# Patient Record
Sex: Female | Born: 1985 | Race: Black or African American | Hispanic: No | Marital: Single | Smoking: Former smoker
Health system: Southern US, Community
[De-identification: ages and names within clinical notes are randomized; demographics above are authoritative.]

## PROBLEM LIST (undated history)

## (undated) ENCOUNTER — Inpatient Hospital Stay (HOSPITAL_COMMUNITY): Payer: Self-pay

## (undated) DIAGNOSIS — F419 Anxiety disorder, unspecified: Secondary | ICD-10-CM

## (undated) DIAGNOSIS — M5020 Other cervical disc displacement, unspecified cervical region: Secondary | ICD-10-CM

## (undated) DIAGNOSIS — A599 Trichomoniasis, unspecified: Secondary | ICD-10-CM

## (undated) DIAGNOSIS — E559 Vitamin D deficiency, unspecified: Secondary | ICD-10-CM

## (undated) DIAGNOSIS — Z8619 Personal history of other infectious and parasitic diseases: Secondary | ICD-10-CM

## (undated) DIAGNOSIS — L309 Dermatitis, unspecified: Secondary | ICD-10-CM

## (undated) DIAGNOSIS — F32A Depression, unspecified: Secondary | ICD-10-CM

## (undated) DIAGNOSIS — F329 Major depressive disorder, single episode, unspecified: Secondary | ICD-10-CM

## (undated) DIAGNOSIS — Z789 Other specified health status: Secondary | ICD-10-CM

## (undated) DIAGNOSIS — D219 Benign neoplasm of connective and other soft tissue, unspecified: Secondary | ICD-10-CM

## (undated) DIAGNOSIS — A749 Chlamydial infection, unspecified: Secondary | ICD-10-CM

## (undated) DIAGNOSIS — O34219 Maternal care for unspecified type scar from previous cesarean delivery: Secondary | ICD-10-CM

## (undated) DIAGNOSIS — B009 Herpesviral infection, unspecified: Secondary | ICD-10-CM

## (undated) DIAGNOSIS — I8001 Phlebitis and thrombophlebitis of superficial vessels of right lower extremity: Secondary | ICD-10-CM

## (undated) HISTORY — PX: WISDOM TOOTH EXTRACTION: SHX21

## (undated) HISTORY — DX: Benign neoplasm of connective and other soft tissue, unspecified: D21.9

## (undated) HISTORY — DX: Vitamin D deficiency, unspecified: E55.9

## (undated) HISTORY — DX: Other cervical disc displacement, unspecified cervical region: M50.20

---
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
## 2006-06-21 ENCOUNTER — Emergency Department (HOSPITAL_COMMUNITY): Admission: EM | Admit: 2006-06-21 | Discharge: 2006-06-21 | Payer: Self-pay | Admitting: Emergency Medicine

## 2006-07-11 ENCOUNTER — Emergency Department (HOSPITAL_COMMUNITY): Admission: EM | Admit: 2006-07-11 | Discharge: 2006-07-11 | Payer: Self-pay | Admitting: Emergency Medicine

## 2006-09-21 ENCOUNTER — Inpatient Hospital Stay (HOSPITAL_COMMUNITY): Admission: AD | Admit: 2006-09-21 | Discharge: 2006-09-21 | Payer: Self-pay | Admitting: Obstetrics and Gynecology

## 2006-12-20 ENCOUNTER — Emergency Department (HOSPITAL_COMMUNITY): Admission: EM | Admit: 2006-12-20 | Discharge: 2006-12-20 | Payer: Self-pay | Admitting: Emergency Medicine

## 2007-04-05 ENCOUNTER — Emergency Department (HOSPITAL_COMMUNITY): Admission: EM | Admit: 2007-04-05 | Discharge: 2007-04-05 | Payer: Self-pay | Admitting: Emergency Medicine

## 2007-04-10 ENCOUNTER — Emergency Department (HOSPITAL_COMMUNITY): Admission: EM | Admit: 2007-04-10 | Discharge: 2007-04-10 | Payer: Self-pay | Admitting: Family Medicine

## 2007-10-27 ENCOUNTER — Emergency Department (HOSPITAL_COMMUNITY): Admission: EM | Admit: 2007-10-27 | Discharge: 2007-10-27 | Payer: Self-pay | Admitting: Family Medicine

## 2007-11-06 ENCOUNTER — Emergency Department (HOSPITAL_COMMUNITY): Admission: EM | Admit: 2007-11-06 | Discharge: 2007-11-06 | Payer: Self-pay | Admitting: Emergency Medicine

## 2008-04-19 ENCOUNTER — Emergency Department (HOSPITAL_COMMUNITY): Admission: EM | Admit: 2008-04-19 | Discharge: 2008-04-19 | Payer: Self-pay | Admitting: Family Medicine

## 2008-05-03 ENCOUNTER — Emergency Department (HOSPITAL_COMMUNITY): Admission: EM | Admit: 2008-05-03 | Discharge: 2008-05-03 | Payer: Self-pay | Admitting: Emergency Medicine

## 2008-06-04 ENCOUNTER — Emergency Department (HOSPITAL_COMMUNITY): Admission: EM | Admit: 2008-06-04 | Discharge: 2008-06-04 | Payer: Self-pay | Admitting: Family Medicine

## 2008-07-21 ENCOUNTER — Emergency Department (HOSPITAL_COMMUNITY): Admission: EM | Admit: 2008-07-21 | Discharge: 2008-07-21 | Payer: Self-pay | Admitting: Emergency Medicine

## 2008-12-17 ENCOUNTER — Emergency Department (HOSPITAL_COMMUNITY): Admission: EM | Admit: 2008-12-17 | Discharge: 2008-12-17 | Payer: Self-pay | Admitting: Family Medicine

## 2009-03-18 ENCOUNTER — Inpatient Hospital Stay (HOSPITAL_COMMUNITY): Admission: AD | Admit: 2009-03-18 | Discharge: 2009-03-19 | Payer: Self-pay | Admitting: Obstetrics and Gynecology

## 2009-06-02 ENCOUNTER — Ambulatory Visit (HOSPITAL_COMMUNITY): Admission: RE | Admit: 2009-06-02 | Discharge: 2009-06-02 | Payer: Self-pay | Admitting: Obstetrics and Gynecology

## 2009-08-02 ENCOUNTER — Inpatient Hospital Stay (HOSPITAL_COMMUNITY): Admission: AD | Admit: 2009-08-02 | Discharge: 2009-08-03 | Payer: Self-pay | Admitting: Obstetrics and Gynecology

## 2009-08-17 ENCOUNTER — Inpatient Hospital Stay (HOSPITAL_COMMUNITY): Admission: AD | Admit: 2009-08-17 | Discharge: 2009-08-18 | Payer: Self-pay | Admitting: Obstetrics and Gynecology

## 2009-08-20 ENCOUNTER — Inpatient Hospital Stay (HOSPITAL_COMMUNITY): Admission: AD | Admit: 2009-08-20 | Discharge: 2009-08-20 | Payer: Self-pay | Admitting: Obstetrics and Gynecology

## 2009-08-21 ENCOUNTER — Inpatient Hospital Stay (HOSPITAL_COMMUNITY): Admission: AD | Admit: 2009-08-21 | Discharge: 2009-08-25 | Payer: Self-pay | Admitting: Obstetrics and Gynecology

## 2009-08-22 ENCOUNTER — Encounter (INDEPENDENT_AMBULATORY_CARE_PROVIDER_SITE_OTHER): Payer: Self-pay | Admitting: Obstetrics and Gynecology

## 2009-12-04 ENCOUNTER — Inpatient Hospital Stay (HOSPITAL_COMMUNITY): Admission: AD | Admit: 2009-12-04 | Discharge: 2009-12-04 | Payer: Self-pay | Admitting: Obstetrics and Gynecology

## 2010-06-17 ENCOUNTER — Encounter: Payer: Self-pay | Admitting: Obstetrics and Gynecology

## 2010-08-12 LAB — GC/CHLAMYDIA PROBE AMP, GENITAL: GC Probe Amp, Genital: NEGATIVE

## 2010-08-12 LAB — URINE MICROSCOPIC-ADD ON

## 2010-08-12 LAB — URINALYSIS, ROUTINE W REFLEX MICROSCOPIC
Bilirubin Urine: NEGATIVE
Glucose, UA: NEGATIVE mg/dL
Ketones, ur: NEGATIVE mg/dL
Leukocytes, UA: NEGATIVE
Nitrite: NEGATIVE
Protein, ur: NEGATIVE mg/dL
Specific Gravity, Urine: 1.02 (ref 1.005–1.030)
Urobilinogen, UA: 0.2 mg/dL (ref 0.0–1.0)
pH: 6.5 (ref 5.0–8.0)

## 2010-08-12 LAB — WET PREP, GENITAL: Trich, Wet Prep: NONE SEEN

## 2010-08-12 LAB — POCT PREGNANCY, URINE: Preg Test, Ur: NEGATIVE

## 2010-08-19 LAB — CBC
HCT: 26 % — ABNORMAL LOW (ref 36.0–46.0)
Hemoglobin: 11.5 g/dL — ABNORMAL LOW (ref 12.0–15.0)
Hemoglobin: 9 g/dL — ABNORMAL LOW (ref 12.0–15.0)
MCHC: 33.4 g/dL (ref 30.0–36.0)
MCV: 95.4 fL (ref 78.0–100.0)
Platelets: 178 10*3/uL (ref 150–400)
RBC: 3.62 MIL/uL — ABNORMAL LOW (ref 3.87–5.11)
WBC: 12.1 10*3/uL — ABNORMAL HIGH (ref 4.0–10.5)
WBC: 14.7 10*3/uL — ABNORMAL HIGH (ref 4.0–10.5)

## 2010-08-19 LAB — RPR: RPR Ser Ql: NONREACTIVE

## 2010-08-30 LAB — URINALYSIS, ROUTINE W REFLEX MICROSCOPIC
Bilirubin Urine: NEGATIVE
Glucose, UA: NEGATIVE mg/dL
Hgb urine dipstick: NEGATIVE
Specific Gravity, Urine: 1.02 (ref 1.005–1.030)
Urobilinogen, UA: 0.2 mg/dL (ref 0.0–1.0)
pH: 6 (ref 5.0–8.0)

## 2010-09-02 LAB — POCT URINALYSIS DIP (DEVICE)
Bilirubin Urine: NEGATIVE
Glucose, UA: NEGATIVE mg/dL
Hgb urine dipstick: NEGATIVE
Ketones, ur: NEGATIVE mg/dL
Nitrite: POSITIVE — AB
Specific Gravity, Urine: 1.015 (ref 1.005–1.030)
pH: 7.5 (ref 5.0–8.0)

## 2010-09-02 LAB — GC/CHLAMYDIA PROBE AMP, GENITAL
Chlamydia, DNA Probe: NEGATIVE
GC Probe Amp, Genital: NEGATIVE

## 2010-09-02 LAB — WET PREP, GENITAL
WBC, Wet Prep HPF POC: NONE SEEN
Yeast Wet Prep HPF POC: NONE SEEN

## 2010-09-02 LAB — URINE CULTURE: Colony Count: >=100000

## 2010-09-11 LAB — POCT URINALYSIS DIP (DEVICE)
Glucose, UA: NEGATIVE mg/dL
Ketones, ur: NEGATIVE mg/dL
Protein, ur: NEGATIVE mg/dL
Urobilinogen, UA: 0.2 mg/dL (ref 0.0–1.0)

## 2010-09-11 LAB — WET PREP, GENITAL
Trich, Wet Prep: NONE SEEN
WBC, Wet Prep HPF POC: NONE SEEN
Yeast Wet Prep HPF POC: NONE SEEN

## 2010-09-11 LAB — POCT PREGNANCY, URINE: Preg Test, Ur: NEGATIVE

## 2010-11-19 ENCOUNTER — Inpatient Hospital Stay (HOSPITAL_COMMUNITY): Payer: Self-pay

## 2010-11-19 ENCOUNTER — Inpatient Hospital Stay (HOSPITAL_COMMUNITY)
Admission: EM | Admit: 2010-11-19 | Discharge: 2010-11-19 | Disposition: A | Discharge status: Home / Self Care | Payer: Self-pay | Source: Ambulatory Visit | Attending: Obstetrics and Gynecology | Admitting: Obstetrics and Gynecology

## 2010-11-19 DIAGNOSIS — N949 Unspecified condition associated with female genital organs and menstrual cycle: Secondary | ICD-10-CM | POA: Insufficient documentation

## 2010-11-19 DIAGNOSIS — N83209 Unspecified ovarian cyst, unspecified side: Secondary | ICD-10-CM | POA: Insufficient documentation

## 2010-11-19 LAB — POCT PREGNANCY, URINE: Preg Test, Ur: NEGATIVE

## 2010-11-19 LAB — URINALYSIS, ROUTINE W REFLEX MICROSCOPIC
Bilirubin Urine: NEGATIVE
Glucose, UA: NEGATIVE mg/dL
Hgb urine dipstick: NEGATIVE
Specific Gravity, Urine: <1.005 — ABNORMAL LOW (ref 1.005–1.030)

## 2011-02-21 LAB — WET PREP, GENITAL: Trich, Wet Prep: NONE SEEN

## 2011-02-21 LAB — POCT URINALYSIS DIP (DEVICE)
Nitrite: POSITIVE — AB
Protein, ur: NEGATIVE
Urobilinogen, UA: 0.2
pH: 6

## 2011-02-21 LAB — GC/CHLAMYDIA PROBE AMP, GENITAL
Chlamydia, DNA Probe: NEGATIVE
GC Probe Amp, Genital: NEGATIVE

## 2011-02-21 LAB — POCT RAPID STREP A: Streptococcus, Group A Screen (Direct): NEGATIVE

## 2011-02-21 LAB — POCT PREGNANCY, URINE: Operator id: 239701

## 2011-02-26 LAB — POCT URINALYSIS DIP (DEVICE)
Protein, ur: 30 mg/dL — AB
Specific Gravity, Urine: 1.02 (ref 1.005–1.030)
Urobilinogen, UA: 0.2 mg/dL (ref 0.0–1.0)

## 2011-02-26 LAB — GC/CHLAMYDIA PROBE AMP, GENITAL: Chlamydia, DNA Probe: NEGATIVE

## 2011-02-26 LAB — WET PREP, GENITAL

## 2011-03-05 LAB — POCT URINALYSIS DIP (DEVICE)
Operator id: 235561
Protein, ur: 100 — AB
Specific Gravity, Urine: 1.02
Urobilinogen, UA: 1
pH: 7

## 2011-03-05 LAB — POCT PREGNANCY, URINE: Operator id: 235561

## 2011-03-05 LAB — WET PREP, GENITAL

## 2011-03-05 LAB — URINE CULTURE

## 2011-03-05 LAB — GC/CHLAMYDIA PROBE AMP, GENITAL: Chlamydia, DNA Probe: NEGATIVE

## 2011-07-20 ENCOUNTER — Ambulatory Visit: Payer: Self-pay

## 2011-07-20 ENCOUNTER — Ambulatory Visit: Payer: Self-pay | Admitting: Internal Medicine

## 2011-07-20 VITALS — BP 108/67 | HR 87 | Temp 98.9°F | Resp 16 | Ht 65.5 in | Wt 173.2 lb

## 2011-07-20 DIAGNOSIS — H66009 Acute suppurative otitis media without spontaneous rupture of ear drum, unspecified ear: Secondary | ICD-10-CM

## 2011-07-20 DIAGNOSIS — H6690 Otitis media, unspecified, unspecified ear: Secondary | ICD-10-CM

## 2011-07-20 MED ORDER — AZITHROMYCIN 250 MG PO TABS: ORAL_TABLET | ORAL | Status: AC

## 2011-07-20 NOTE — Progress Notes (Signed)
  Subjective:    Patient ID: [name], female    DOB: 1985-08-01, 26 y.o.   MRN: [id number]  HPI Pain in the left ear for 3 days Popping when swallows 3/2 weeks ago had wisdom teeth removed/was treated postoperatively with amoxicillin and developed a peeling of the skin in the vaginal area so stopped it 39-year-old son with recent URI and ear infection Occasional cough especially at night No sore throat or purulent nasal discharge   Review of Systems     Objective:   Physical Exam Eyes are clear The left TM is red and retracted The right TM is clear Both canals are clear The nose and oropharynx are clear No anterior cervical adenopathy       Assessment & Plan:  OM-Acute Zithromax as Z-Pak Followup 7-14 days if not well

## 2011-10-13 ENCOUNTER — Encounter (HOSPITAL_COMMUNITY): Payer: Self-pay | Admitting: Emergency Medicine

## 2011-10-13 ENCOUNTER — Emergency Department (HOSPITAL_COMMUNITY)
Admission: EM | Admit: 2011-10-13 | Discharge: 2011-10-13 | Disposition: A | Discharge status: Home / Self Care | Payer: Self-pay | Attending: Emergency Medicine | Admitting: Emergency Medicine

## 2011-10-13 DIAGNOSIS — A5901 Trichomonal vulvovaginitis: Secondary | ICD-10-CM | POA: Insufficient documentation

## 2011-10-13 DIAGNOSIS — N390 Urinary tract infection, site not specified: Secondary | ICD-10-CM | POA: Insufficient documentation

## 2011-10-13 DIAGNOSIS — R102 Pelvic and perineal pain: Secondary | ICD-10-CM

## 2011-10-13 DIAGNOSIS — N949 Unspecified condition associated with female genital organs and menstrual cycle: Secondary | ICD-10-CM | POA: Insufficient documentation

## 2011-10-13 LAB — URINALYSIS, ROUTINE W REFLEX MICROSCOPIC
Hgb urine dipstick: NEGATIVE
Nitrite: NEGATIVE
Protein, ur: NEGATIVE mg/dL
Specific Gravity, Urine: 1.015 (ref 1.005–1.030)
Urobilinogen, UA: 0.2 mg/dL (ref 0.0–1.0)

## 2011-10-13 LAB — URINE MICROSCOPIC-ADD ON

## 2011-10-13 LAB — POCT PREGNANCY, URINE: Preg Test, Ur: NEGATIVE

## 2011-10-13 LAB — WET PREP, GENITAL

## 2011-10-13 MED ORDER — METRONIDAZOLE 500 MG PO TABS
2000.0000 mg | ORAL_TABLET | Freq: Once | ORAL | Status: AC
Start: 2011-10-13 — End: 2011-10-13
  Administered 2011-10-13: 2000 mg via ORAL
  Filled 2011-10-13: qty 1
  Filled 2011-10-13: qty 3

## 2011-10-13 MED ORDER — SULFAMETHOXAZOLE-TRIMETHOPRIM 800-160 MG PO TABS: 1.0000 | ORAL_TABLET | Freq: Two times a day (BID) | ORAL | Status: AC

## 2011-10-13 MED ORDER — HYDROCODONE-ACETAMINOPHEN 5-325 MG PO TABS: 1.0000 | ORAL_TABLET | ORAL | Status: AC | PRN

## 2011-10-13 NOTE — ED Notes (Signed)
Pt alert, nad, c/o abd pain onset "on and on", pt states "I was seen and i have a cyst, i am having the same symptoms", resp even unlabored, skin pwd

## 2011-10-13 NOTE — ED Notes (Signed)
Patient is alert and oriented x3.  She was given DC instructions and follow up visit instructions.  Patient gave verbal understanding. She was DC ambulatory under his own power to home.  V/S stable.  He was not showing any signs of distress on DC 

## 2011-10-13 NOTE — Discharge Instructions (Signed)
Take antibiotic as prescribed.  Take vicodin as prescribed for severe pain.  Follow up with [facility] [phone]; [address]) as soon as possible for further evaluation of pain.  You should return to the ER if your pain worsens or you develop associated fever.

## 2011-10-13 NOTE — ED Provider Notes (Signed)
History     CSN: [id number]  Arrival date & time 10/13/11  0602   First MD Initiated Contact with Patient 10/13/11 [phone]      Chief Complaint  Patient presents with  . Abdominal Pain    (Consider location/radiation/quality/duration/timing/severity/associated sxs/prior treatment) HPI History provided by pt.   Pt reports that she was evaluated at [facility] 2-3 months ago for LLQ pain and was diagnosed w/ ruptured ovarian cyst.  There is no record of this encounter.  Ever since, she has had one-two daily episodes of severe, sharp LLQ pain that radiates to the back.  Episodes are preceded by nausea and usually last 15 minutes.  She has to sit down and not move during episodes of pain.  Pain feels similar to what prompted her to go to [facility].  Denies fever, diarrhea, hematochezia/melena, GU sx.  Pt has had a mirena for approx 2 years.  Denies trauma and recent heavy lifting.  History reviewed. No pertinent past medical history.  History reviewed. No pertinent past surgical history.  No family history on file.  History  Substance Use Topics  . Smoking status: Current Everyday Smoker -- 1.5 packs/day    Types: Cigarettes  . Smokeless tobacco: Not on file  . Alcohol Use: No    OB History    Grav Para Term Preterm Abortions TAB SAB Ect Mult Living                  Review of Systems  All other systems reviewed and are negative.    Allergies  Amoxicillin and Amoxapine and related  Home Medications  No current outpatient prescriptions on file.  BP 117/73  Pulse 83  Temp(Src) 97.4 F (36.3 C) (Oral)  Resp 20  SpO2 100%  Physical Exam  Nursing note and vitals reviewed. Constitutional: She is oriented to person, place, and time. She appears well-developed and well-nourished. No distress.  HENT:  Head: Normocephalic and atraumatic.  Eyes:       Normal appearance  Neck: Normal range of motion.  Cardiovascular: Normal rate and regular rhythm.     Pulmonary/Chest: Effort normal and breath sounds normal. No respiratory distress.  Abdominal: Soft. Bowel sounds are normal. She exhibits no distension and no mass. There is no rebound and no guarding.       Mild tenderness of LLQ only.   Genitourinary:       No CVA tenderness.  Nml external genitalia.  No vaginal discharge/bleeding.  Cervix closed and appears nml. No cervical motion tenderness.  Left adnexa reported to be mildly tender but patient does not appear uncomfortable w/ palpation.  Musculoskeletal: Normal range of motion.  Neurological: She is alert and oriented to person, place, and time.  Skin: Skin is warm and dry. No rash noted.  Psychiatric: She has a normal mood and affect. Her behavior is normal.    ED Course  Procedures (including critical care time)  Labs Reviewed  WET PREP, GENITAL - Abnormal; Notable for the following:    Trich, Wet Prep FEW (*)    Clue Cells Wet Prep HPF POC FEW (*)    WBC, Wet Prep HPF POC FEW (*)    All other components within normal limits  URINALYSIS, ROUTINE W REFLEX MICROSCOPIC - Abnormal; Notable for the following:    APPearance CLOUDY (*)    Leukocytes, UA LARGE (*)    All other components within normal limits  URINE MICROSCOPIC-ADD ON - Abnormal; Notable for the following:  Squamous Epithelial / LPF MANY (*)    Bacteria, UA MANY (*)    All other components within normal limits  POCT PREGNANCY, URINE  GC/CHLAMYDIA PROBE AMP, GENITAL   No results found.   1. Pelvic pain   2. UTI (lower urinary tract infection)   3. Vaginal trichomoniasis       MDM  Healthy 25yo F presents w/ 1-2 months of intermittent  episodes of severe LLQ pain.  Was supposedly diagnosed w/ a ruptured L ovarian cyst at [facility] at onset of sx but no record of this encounter.  On exam, pt in well-appearing, afebrile, mild LLQ ttp, reported mild tenderness of left adnexa.  Labs sig for UTI and trich.  Based on duration of symptoms and today's exam,  TOA and torsion very unlikely.  Pt received 2g po flagyl and d/c'd home w/ bactrim.   Referred to [facility] for further evaluation.  Return precautions discussed.         Otilio Miu, PA 10/13/11 0910  Otilio Miu, PA 10/13/11 [phone]

## 2011-10-15 LAB — GC/CHLAMYDIA PROBE AMP, GENITAL: GC Probe Amp, Genital: NEGATIVE

## 2011-10-15 NOTE — ED Provider Notes (Signed)
Medical screening examination/treatment/procedure(s) were performed by non-physician practitioner and as supervising physician I was immediately available for consultation/collaboration.   Mallika Sanmiguel A Abrish Erny, MD 10/15/11 0739 

## 2011-10-20 ENCOUNTER — Emergency Department (HOSPITAL_COMMUNITY)
Admission: EM | Admit: 2011-10-20 | Discharge: 2011-10-20 | Disposition: A | Discharge status: Home / Self Care | Payer: Self-pay | Attending: Emergency Medicine | Admitting: Emergency Medicine

## 2011-10-20 ENCOUNTER — Encounter (HOSPITAL_COMMUNITY): Payer: Self-pay | Admitting: Emergency Medicine

## 2011-10-20 DIAGNOSIS — N760 Acute vaginitis: Secondary | ICD-10-CM | POA: Insufficient documentation

## 2011-10-20 DIAGNOSIS — F17200 Nicotine dependence, unspecified, uncomplicated: Secondary | ICD-10-CM | POA: Insufficient documentation

## 2011-10-20 DIAGNOSIS — R1110 Vomiting, unspecified: Secondary | ICD-10-CM

## 2011-10-20 DIAGNOSIS — B9689 Other specified bacterial agents as the cause of diseases classified elsewhere: Secondary | ICD-10-CM | POA: Insufficient documentation

## 2011-10-20 DIAGNOSIS — A499 Bacterial infection, unspecified: Secondary | ICD-10-CM | POA: Insufficient documentation

## 2011-10-20 DIAGNOSIS — R112 Nausea with vomiting, unspecified: Secondary | ICD-10-CM | POA: Insufficient documentation

## 2011-10-20 DIAGNOSIS — N9489 Other specified conditions associated with female genital organs and menstrual cycle: Secondary | ICD-10-CM | POA: Insufficient documentation

## 2011-10-20 DIAGNOSIS — R109 Unspecified abdominal pain: Secondary | ICD-10-CM | POA: Insufficient documentation

## 2011-10-20 LAB — WET PREP, GENITAL: Trich, Wet Prep: NONE SEEN

## 2011-10-20 MED ORDER — ONDANSETRON HCL 4 MG/2ML IJ SOLN
4.0000 mg | Freq: Once | INTRAMUSCULAR | Status: AC
  Administered 2011-10-20: 4 mg via INTRAVENOUS
  Filled 2011-10-20: qty 2

## 2011-10-20 MED ORDER — METRONIDAZOLE 0.75 % VA GEL: 1.0000 | Freq: Two times a day (BID) | VAGINAL | Status: AC

## 2011-10-20 MED ORDER — DICYCLOMINE HCL 10 MG/ML IM SOLN
20.0000 mg | Freq: Once | INTRAMUSCULAR | Status: AC
  Administered 2011-10-20: 20 mg via INTRAMUSCULAR
  Filled 2011-10-20: qty 2

## 2011-10-20 MED ORDER — SODIUM CHLORIDE 0.9 % IV BOLUS (SEPSIS)
1000.0000 mL | Freq: Once | INTRAVENOUS | Status: AC
  Administered 2011-10-20: 1000 mL via INTRAVENOUS

## 2011-10-20 MED ORDER — PROMETHAZINE HCL 25 MG PO TABS: 25.0000 mg | ORAL_TABLET | Freq: Four times a day (QID) | ORAL | Status: DC | PRN

## 2011-10-20 MED ORDER — ONDANSETRON 4 MG PO TBDP
4.0000 mg | ORAL_TABLET | Freq: Once | ORAL | Status: AC
  Administered 2011-10-20: 4 mg via ORAL
  Filled 2011-10-20: qty 1

## 2011-10-20 NOTE — ED Notes (Signed)
Patient is alert and oriented x3.  She was given DC instructions and follow up visit instructions.  Patient gave verbal understanding. She was DC ambulatory under his own power to home.  V/S stable.  He was not showing any signs of distress on DC 

## 2011-10-20 NOTE — Discharge Instructions (Signed)
Bacterial Vaginosis Bacterial vaginosis is an infection of the vagina. A healthy vagina has many kinds of good germs (bacteria). Sometimes the number of good germs can change. This allows bad germs to move in and cause an infection. You may be given medicine (antibiotics) to treat the infection. Or, you may not need treatment at all. HOME CARE  Take your medicine as told. Finish them even if you start to feel better.   Do not have sex until you finish your medicine.   Do not douche.   Practice safe sex.   Tell your sex partner that you have an infection. They should see their doctor for treatment if they have problems.  GET HELP RIGHT AWAY IF:  You do not get better after 3 days of treatment.   You have grey fluid (discharge) coming from your vagina.   You have pain.   You have a temperature of 102 F (38.9 C) or higher.  MAKE SURE YOU:   Understand these instructions.   Will watch your condition.   Will get help right away if you are not doing well or get worse.  Document Released: 02/20/2008 Document Revised: 05/02/2011 Document Reviewed: 02/20/2008 [facility] Patient Information 2012 [place], [place].Clear Liquid Diet The clear liquid dietconsists of foods that are liquid or will become liquid at room temperature.You should be able to see through the liquid and beverages. Examples of foods allowed on a clear liquid diet include fruit juice, broth or bouillon, gelatin, or frozen ice pops. The purpose of this diet is to provide necessary fluid, electrolytes such as sodium and potassium, and energy to keep the body functioning during times when you are not able to consume a regular diet.A clear liquid diet should not be continued for long periods of time as it is not nutritionally adequate.  REASONS FOR USING A CLEAR LIQUID DIET  In sudden onset (acute) conditions for a patient before or after surgery.   As the first step in oral feeding.   For fluid and electrolyte  replacement in diarrheal diseases.   As a diet before certain medical tests are performed.  ADEQUACY The clear liquid diet is adequate only in ascorbic acid, according to the Recommended Dietary Allowances of the [employer]. CHOOSING FOODS Breads and Starches  Allowed:  None are allowed.   Avoid: All are avoided.  Vegetables  Allowed:  Strained tomato or vegetable juice.   Avoid: Any others.  Fruit  Allowed:  Strained fruit juices and fruit drinks. Include 1 serving of citrus or vitamin C-enriched fruit juice daily.   Avoid: Any others.  Meat and Meat Substitutes  Allowed:  None are allowed.   Avoid: All are avoided.  Milk  Allowed:  None are allowed.   Avoid: All are avoided.  Soups and Combination Foods  Allowed:  Clear bouillon, broth, or strained broth-based soups.   Avoid: Any others.  Desserts and Sweets  Allowed:  Sugar, honey. High protein gelatin. Flavored gelatin, ices, or frozen ice pops that do not contain milk.   Avoid: Any others.  Fats and Oils  Allowed:  None are allowed.   Avoid: All are avoided.  Beverages  Allowed: Cereal beverages, coffee (regular or decaffeinated), tea, or soda at the discretion of your caregiver.   Avoid: Any others.  Condiments  Allowed:  Iodized salt.   Avoid: Any others, including pepper.  Supplements  Allowed:  Liquid nutrition beverages.   Avoid: Any others that contain lactose or fiber.  SAMPLE MEAL  PLAN Breakfast  4 oz (120 mL) strained orange juice.    to 1 cup (125 to 250 mL) gelatin (plain or fortified).   1 cup (250 mL) beverage (coffee or tea).   Sugar, if desired.  Midmorning Snack   cup (125 mL) gelatin (plain or fortified).  Lunch  1 cup (250 mL) broth or consomm.   4 oz (120 mL) strained grapefruit juice.    cup (125 mL) gelatin (plain or fortified).   1 cup (250 mL) beverage (coffee or tea).   Sugar, if desired.  Midafternoon Snack   cup (125 mL) fruit  ice.    cup (125 mL) strained fruit juice.  Dinner  1 cup (250 mL) broth or consomm.    cup (125 mL) cranberry juice.    cup (125 mL) flavored gelatin (plain or fortified).   1 cup (250 mL) beverage (coffee or tea).   Sugar, if desired.  Evening Snack  4 oz (120 mL) strained apple juice (vitamin C-fortified).    cup (125 mL) flavored gelatin (plain or fortified).  Document Released: 05/13/2005 Document Revised: 05/02/2011 Document Reviewed: 08/10/2010 [facility] Patient Information 2012 [place], [place].General Instructions for Vaginal Infections Vaginitis is a term to describe many common vaginal infections. These infections may be due to an imbalance of normal germs (bacteria) that exist in the vagina. Many others are caused by sexually transmitted diseases (STD's). If any medication was prescribed to treat your specific infection, it is very important that you take the medication as directed. Your caregiver may want to examine and treat your sex partner. CAUSES  The vagina normally contains organisms (bacteria and yeast) in a balance. Certain factors can disturb this balance and cause an infection, such as:  Sexual intercourse.   Nursing.   Pregnancy.   Menopause.   Hormone changes in the body.   Antibiotics.   Infection elsewhere in your body.   Birth control pills or patches.   Douches.   Spermicides.   Medical illnesses (diabetes).  SYMPTOMS  Different types of vaginal infections cause symptoms such as:  Itching.   Pain or burning.   Bad odor.   Pain or bleeding with sexual intercourse.   Redness of the vulva.   Abnormal discharge (yellow, green, heavy white and thick).   Fever.   A sore on the vulva or vagina.   Urinary symptoms (painful or bloody urine).   Pelvic and/or abdominal pain.   Rectal bleeding, discharge or pain.  DIAGNOSIS   Your caregiver will base the diagnosis upon the symptoms that you report.   A complete history of  your sex life may be taken   You may have a pelvic exam.   A sample of your vaginal fluid and/or discharge will be examined under the microscope.   Cultures will help complete the exact diagnosis.  TREATMENT  Treatment depends on the cause of your vaginitis. Your treatment may include a medicine that kills germs (antibiotic). The antibiotic may be a shot, a pill, and/or vaginal suppository or cream. It is not uncommon for more than one type of infection to be present. If more than one infection is present, two or more medications may be required. Reoccurrence of vaginal infections may be treated with vaginal suppositories or vaginal cream 2 times a week, or as directed. If your caregiver finds that an STD exists, treatment of your sexual partner(s) is important. This is especially important for those infected with chlamydia, gonorrhea, trichomoniasis, bacterial vaginosis, syphilis and HIV infections. Treating  sexual partners will prevent you from being re-infected and help stop the spread of STD infection to others. Although it is best to see a specialist for STD/HIV testing and counseling, this is not always possible. Some states/provinces permit something called "expedited partner therapy." This kind of program permits you to deliver prescription(s) to a partner without the partner having to seek a formal medical exam.  HOME CARE INSTRUCTIONS   Take all prescribed medication.   If applicable, speak to your partner about recommended treatment.   Do not have sexual intercourse for one week, or as directed by your caregiver.   Practice safe sex.   Use condoms.   Have only one sex partner.   Make sure your sex partner does not have any other sex partners.   Avoid tight pants and panty hose.   Wear cotton underwear.   Do not douche.   Avoid tampons, especially scented ones.   Take warm sitz baths.   Avoid vaginal sprays and perfumed soaps or bath oils.   Apply medicated cream  (steroid cream) for itching or irritation with the permission of your caregiver.  SEEK MEDICAL CARE IF:   You have any kind of abnormal vaginal discharge.   Your sex partner has a genital infection.   You have pain or bleeding with sexual intercourse.   You have itching, pain, irritation or bleeding of the vulva.  SEEK IMMEDIATE MEDICAL CARE IF:   You have an oral temperature above 102 F (38.9 C), not controlled by medicine.   You have belly (abdominal) pain.   Symptoms do not improve within 3 days or as directed.   You develop painful or bloody urine.   You develop rectal pain, bleeding or discharge.  Document Released: 02/20/2005 Document Revised: 05/02/2011 Document Reviewed: 10/06/2008 [facility] Patient Information 2012 [place], [place].

## 2011-10-20 NOTE — ED Provider Notes (Signed)
History     CSN: [id number]  Arrival date & time 10/20/11  0105   First MD Initiated Contact with Patient 10/20/11 0130      Chief Complaint  Patient presents with  . Vaginitis    (Consider location/radiation/quality/duration/timing/severity/associated sxs/prior treatment) HPI Comments: Patient here after awakening this evening and discovering that her right labia was swollen and she felt like the skin was peeling.  She also reports crampy epigastric abdominal pain with multiple episodes of NBNB vomit.  She states this is new for tonight - she states was treated last week for trichamoniasis and PID, states that she finished her antibiotic yesterday and took the 2gm of flagyl.  She reports continued thin white vaginal discharge.  Denies fever, chills, states there is no real change in her abdominal tenderness.  Patient is a 26 y.o. female presenting with vaginal discharge. The history is provided by the patient. No language interpreter was used.  Vaginal Discharge This is a new problem. The current episode started today. The problem occurs constantly. The problem has been unchanged. Associated symptoms include abdominal pain, nausea, a rash and vomiting. Pertinent negatives include no anorexia, arthralgias, change in bowel habit, chest pain, chills, congestion, coughing, diaphoresis, fatigue, fever, headaches, joint swelling, myalgias, neck pain, numbness, sore throat, swollen glands, urinary symptoms, vertigo, visual change or weakness. The symptoms are aggravated by nothing. She has tried nothing for the symptoms. The treatment provided no relief.    History reviewed. No pertinent past medical history.  History reviewed. No pertinent past surgical history.  No family history on file.  History  Substance Use Topics  . Smoking status: Current Everyday Smoker -- 1.5 packs/day    Types: Cigarettes  . Smokeless tobacco: Not on file  . Alcohol Use: No    OB History    Grav Para Term  Preterm Abortions TAB SAB Ect Mult Living                  Review of Systems  Constitutional: Negative for fever, chills, diaphoresis and fatigue.  HENT: Negative for congestion, sore throat and neck pain.   Respiratory: Negative for cough.   Cardiovascular: Negative for chest pain.  Gastrointestinal: Positive for nausea, vomiting and abdominal pain. Negative for anorexia and change in bowel habit.  Genitourinary: Positive for vaginal discharge.  Musculoskeletal: Negative for myalgias, joint swelling and arthralgias.  Skin: Positive for rash.  Neurological: Negative for vertigo, weakness, numbness and headaches.  All other systems reviewed and are negative.    Allergies  Amoxicillin and Amoxapine and related  Home Medications   Current Outpatient Rx  Name Route Sig Dispense Refill  . HYDROCODONE-ACETAMINOPHEN 5-325 MG PO TABS Oral Take 1 tablet by mouth every 4 (four) hours as needed for pain. 12 tablet 0  . SULFAMETHOXAZOLE-TRIMETHOPRIM 800-160 MG PO TABS Oral Take 1 tablet by mouth 2 (two) times daily. 6 tablet 0    BP 119/74  Pulse 76  Temp 98 F (36.7 C)  Resp 16  SpO2 99%  Physical Exam  Nursing note and vitals reviewed. Constitutional: She is oriented to person, place, and time. She appears well-developed and well-nourished. No distress.  HENT:  Head: Normocephalic and atraumatic.  Right Ear: External ear normal.  Left Ear: External ear normal.  Nose: Nose normal.  Mouth/Throat: Oropharynx is clear and moist. No oropharyngeal exudate.  Eyes: Conjunctivae are normal. Pupils are equal, round, and reactive to light. No scleral icterus.  Neck: Normal range of motion. Neck supple.  Cardiovascular: Normal rate, regular rhythm and normal heart sounds.  Exam reveals no gallop and no friction rub.   No murmur heard. Pulmonary/Chest: Effort normal and breath sounds normal. No respiratory distress. She has no wheezes. She has no rales. She exhibits no tenderness.    Abdominal: Soft. Bowel sounds are normal. She exhibits no distension and no mass. There is tenderness. There is no rebound and no guarding.       Mild epigastric ttp  Genitourinary: There is tenderness on the right labia. There is no rash or lesion on the right labia. There is no rash, tenderness or lesion on the left labia. Cervix exhibits discharge. Cervix exhibits no motion tenderness and no friability. Right adnexum displays no mass and no tenderness. Left adnexum displays no mass and no tenderness. No erythema or tenderness around the vagina. Vaginal discharge found.  Musculoskeletal: Normal range of motion. She exhibits no edema and no tenderness.  Lymphadenopathy:    She has no cervical adenopathy.  Neurological: She is alert and oriented to person, place, and time. No cranial nerve deficit.  Skin: Skin is warm and dry. No rash noted. No erythema. No pallor.  Psychiatric: She has a normal mood and affect. Her behavior is normal. Judgment and thought content normal.    ED Course  Procedures (including critical care time)   Labs Reviewed  GC/CHLAMYDIA PROBE AMP, GENITAL  WET PREP, GENITAL   No results found.  Results for orders placed during the hospital encounter of 10/20/11  WET PREP, GENITAL      Component Value Range   Yeast Wet Prep HPF POC NONE SEEN  NONE SEEN    Trich, Wet Prep NONE SEEN  NONE SEEN    Clue Cells Wet Prep HPF POC FEW (*) NONE SEEN    WBC, Wet Prep HPF POC TOO NUMEROUS TO COUNT (*) NONE SEEN    No results found.   Bacterial Vaginosis Vomiting    MDM  Patient here with vaginal discharge who also complains of crampy abdominal pain with vomiting - reports improvement in pain after the bentyl.  Plan to discharge on the metrogel and nausea medication.        Izola Price [place], [place] 10/20/11 [phone]

## 2011-10-20 NOTE — ED Provider Notes (Signed)
Medical screening examination/treatment/procedure(s) were performed by non-physician practitioner and as supervising physician I was immediately available for consultation/collaboration.   Hannie Shoe L Ignatius Kloos, MD 10/20/11 0515 

## 2011-10-20 NOTE — ED Notes (Signed)
Pt alert, nad, seen in ER last week, recently treated for vaginal infection, states returns today with "vaginal peeling", resp even unlabored, skin pwd

## 2011-10-20 NOTE — ED Notes (Addendum)
Pt c/o of skin peeling in vagina that started 2 days ago. The peels are small and she can see it when she wipes herself and in the toilet when she uses the bathroom. Vagina is painful to the touch only on the right side for the past day or so towards the last two antibiotics (sulfa) she had to take for a vaginal infection last week. She vomited 3 times in past 24 hours. Emesis was brownish/red (she says most likely due to what she ate).

## 2012-03-22 ENCOUNTER — Encounter (HOSPITAL_COMMUNITY): Payer: Self-pay | Admitting: *Deleted

## 2012-03-22 ENCOUNTER — Inpatient Hospital Stay (HOSPITAL_COMMUNITY)
Admission: AD | Admit: 2012-03-22 | Discharge: 2012-03-22 | Disposition: A | Discharge status: Home / Self Care | Payer: Self-pay | Source: Ambulatory Visit | Attending: Obstetrics & Gynecology | Admitting: Obstetrics & Gynecology

## 2012-03-22 DIAGNOSIS — R109 Unspecified abdominal pain: Secondary | ICD-10-CM | POA: Insufficient documentation

## 2012-03-22 DIAGNOSIS — B9689 Other specified bacterial agents as the cause of diseases classified elsewhere: Secondary | ICD-10-CM | POA: Insufficient documentation

## 2012-03-22 DIAGNOSIS — M5432 Sciatica, left side: Secondary | ICD-10-CM

## 2012-03-22 DIAGNOSIS — M549 Dorsalgia, unspecified: Secondary | ICD-10-CM | POA: Insufficient documentation

## 2012-03-22 DIAGNOSIS — M5430 Sciatica, unspecified side: Secondary | ICD-10-CM | POA: Insufficient documentation

## 2012-03-22 DIAGNOSIS — N760 Acute vaginitis: Secondary | ICD-10-CM | POA: Insufficient documentation

## 2012-03-22 DIAGNOSIS — A499 Bacterial infection, unspecified: Secondary | ICD-10-CM | POA: Insufficient documentation

## 2012-03-22 HISTORY — DX: Other specified health status: Z78.9

## 2012-03-22 LAB — URINALYSIS, ROUTINE W REFLEX MICROSCOPIC
Bilirubin Urine: NEGATIVE
Glucose, UA: NEGATIVE mg/dL
Hgb urine dipstick: NEGATIVE
Ketones, ur: NEGATIVE mg/dL
Leukocytes, UA: NEGATIVE
Nitrite: NEGATIVE
Protein, ur: NEGATIVE mg/dL
Specific Gravity, Urine: <1.005 — ABNORMAL LOW (ref 1.005–1.030)
Urobilinogen, UA: 0.2 mg/dL (ref 0.0–1.0)
pH: 7 (ref 5.0–8.0)

## 2012-03-22 LAB — WET PREP, GENITAL
Trich, Wet Prep: NONE SEEN
Yeast Wet Prep HPF POC: NONE SEEN

## 2012-03-22 MED ORDER — OXYCODONE-ACETAMINOPHEN 5-325 MG PO TABS: 1.0000 | ORAL_TABLET | Freq: Four times a day (QID) | ORAL | Status: DC | PRN

## 2012-03-22 MED ORDER — FLUCONAZOLE 150 MG PO TABS: 150.0000 mg | ORAL_TABLET | Freq: Once | ORAL | Status: DC

## 2012-03-22 MED ORDER — METRONIDAZOLE 500 MG PO TABS: 500.0000 mg | ORAL_TABLET | Freq: Two times a day (BID) | ORAL | Status: DC

## 2012-03-22 NOTE — MAU Note (Signed)
Past couple wks have pain LL back when cough, sneeze, bend over. Shoots across lower back and down L buttocks. Some cramping LLQ that goes across lower abd. Have mirana in  And not period for 2 yrs. Last wkend had dark spotting and then bright spotting few days ago. No bleeding but white d/c. Vomiting for couple wks.

## 2012-03-22 NOTE — [facility] Provider Note (Signed)
CC: Abdominal Pain, Back Pain and Emesis    First Provider Initiated Contact with Patient 03/22/12 0115      HPI [name] ia a 26 y.o. [id number] who presents with 3-4 months history of intermittent sharp shooting pain in her left buttock that sometimes radiates across her lower back and down her left lateral thigh.the pain is worse when she moves in certain way or when she coughs or sneezes. For about the same length of time she's had left suprapubic abdominal pain which she thinks occurs about once a month about the time she would get her period. She she has been amenorrheic with the Mirena IUD for 2 years. She is also concerned because she had spotting which was a streak of pink on the toilet paper yesterday. Discharge is white and has a bad odor. Has one sexual partner and is not sure if he is monogamous so would like STD testing. In addition she has had some nausea and vomiting on and off for 2 weeks but is not nauseated now. Has gotten a bad yeast infection when she has had Flagyl in the past.    Past Medical History  Diagnosis Date  . No pertinent past medical history     OB History    Grav Para Term Preterm Abortions TAB SAB Ect Mult Living   2 1 1  1  1         # Outc Date GA Lbr Len/2nd Wgt Sex Del Anes PTL Lv   1 TRM 3/11    M LTCS      2 SAB               Past Surgical History  Procedure Date  . Cesarean section     History   Social History  . Marital Status: Single    Spouse Name: N/A    Number of Children: N/A  . Years of Education: N/A   Occupational History  . Not on file.   Social History Main Topics  . Smoking status: Current Every Day Smoker -- 1.5 packs/day    Types: Cigarettes  . Smokeless tobacco: Not on file  . Alcohol Use: No  . Drug Use: No  . Sexually Active: Yes   Other Topics Concern  . Not on file   Social History Narrative  . No narrative on file    No current facility-administered medications on file prior to encounter.    Current Outpatient Prescriptions on File Prior to Encounter  Medication Sig Dispense Refill  . diphenhydramine-acetaminophen (TYLENOL PM) 25-500 MG TABS Take 1 tablet by mouth at bedtime as needed.      . promethazine (PHENERGAN) 25 MG tablet Take 1 tablet (25 mg total) by mouth every 6 (six) hours as needed for nausea.  30 tablet  0    Allergies  Allergen Reactions  . Amoxicillin Other (See Comments)    Skin peeling  . Amoxapine And Related   . Clindamycin/Lincomycin Other (See Comments)    Skin peels on perineum, lower pubic area, and groin. Same reaction as  amoxicillin    ROS Pertinent items in HPI  PHYSICAL EXAM General: Well nourished, well developed female in no acute distress Cardiovascular: Normal rate Respiratory: Normal effort Abdomen: Soft, ND, slightly tender left suprapubic, no guarding or rebound Back: No CVAT. Left buttock tender at S-I joint Extremities: No edema Neurologic: Alert and oriented  Speculum exam: NEFG; vagina with thin white discharge, no blood; cervix clean with IUD strings seen  Bimanual exam: cervix closed, no CMT; uterus NSSP; minimal left adnexal tenderness, no masses  LAB RESULTS Results for orders placed during the hospital encounter of 03/22/12 (from the past 24 hour(s))  URINALYSIS, ROUTINE W REFLEX MICROSCOPIC     Status: Abnormal   Collection Time   03/22/12 12:30 AM      Component Value Range   Color, Urine YELLOW  YELLOW   APPearance CLEAR  CLEAR   Specific Gravity, Urine <1.005 (*) 1.005 - 1.030   pH 7.0  5.0 - 8.0   Glucose, UA NEGATIVE  NEGATIVE mg/dL   Hgb urine dipstick NEGATIVE  NEGATIVE   Bilirubin Urine NEGATIVE  NEGATIVE   Ketones, ur NEGATIVE  NEGATIVE mg/dL   Protein, ur NEGATIVE  NEGATIVE mg/dL   Urobilinogen, UA 0.2  0.0 - 1.0 mg/dL   Nitrite NEGATIVE  NEGATIVE   Leukocytes, UA NEGATIVE  NEGATIVE  POCT PREGNANCY, URINE     Status: Normal   Collection Time   03/22/12 12:36 AM      Component Value Range    Preg Test, Ur NEGATIVE  NEGATIVE  WET PREP, GENITAL     Status: Abnormal   Collection Time   03/22/12  1:25 AM      Component Value Range   Yeast Wet Prep HPF POC NONE SEEN  NONE SEEN   Trich, Wet Prep NONE SEEN  NONE SEEN   Clue Cells Wet Prep HPF POC FEW (*) NONE SEEN   WBC, Wet Prep HPF POC FEW (*) NONE SEEN      ASSESSMENT 1. Left sided sciatica   BV  PLAN Discharge home.  Advised to see family physician in Urgent Care facility or ED as imaging may be needed if back pain persists. Tylenol for pain. Rx: Percocet for breakthrough pain. GYN Clinic for F/U See AVS for patient education   Medication List     As of 03/22/2012  2:05 AM    TAKE these medications         diphenhydramine-acetaminophen 25-500 MG Tabs   Commonly known as: TYLENOL PM   Take 1 tablet by mouth at bedtime as needed.      fluconazole 150 MG tablet   Commonly known as: DIFLUCAN   Take 1 tablet (150 mg total) by mouth once.      metroNIDAZOLE 500 MG tablet   Commonly known as: FLAGYL   Take 1 tablet (500 mg total) by mouth every 12 (twelve) hours.      oxyCODONE-acetaminophen 5-325 MG per tablet   Commonly known as: PERCOCET/ROXICET   Take 1-2 tablets by mouth every 6 (six) hours as needed.      promethazine 25 MG tablet   Commonly known as: PHENERGAN   Take 1 tablet (25 mg total) by mouth every 6 (six) hours as needed for nausea.          Danae Orleans, CNM 03/22/2012 1:19 AM

## 2012-03-22 NOTE — Progress Notes (Signed)
Bernita Buffy CNM in and discussed lab results and d/c plan.Written and verbal d/c instructions given and understanding voiced.

## 2012-03-23 LAB — GC/CHLAMYDIA PROBE AMP, GENITAL: Chlamydia, DNA Probe: NEGATIVE

## 2012-04-12 ENCOUNTER — Emergency Department (HOSPITAL_COMMUNITY)
Admission: EM | Admit: 2012-04-12 | Discharge: 2012-04-12 | Disposition: A | Discharge status: Home / Self Care | Payer: Self-pay | Attending: Emergency Medicine | Admitting: Emergency Medicine

## 2012-04-12 ENCOUNTER — Encounter (HOSPITAL_COMMUNITY): Payer: Self-pay | Admitting: *Deleted

## 2012-04-12 DIAGNOSIS — M545 Low back pain, unspecified: Secondary | ICD-10-CM | POA: Insufficient documentation

## 2012-04-12 DIAGNOSIS — F17200 Nicotine dependence, unspecified, uncomplicated: Secondary | ICD-10-CM | POA: Insufficient documentation

## 2012-04-12 MED ORDER — METHOCARBAMOL 500 MG PO TABS: 500.0000 mg | ORAL_TABLET | Freq: Two times a day (BID) | ORAL | Status: DC

## 2012-04-12 MED ORDER — NAPROXEN 375 MG PO TABS: 375.0000 mg | ORAL_TABLET | Freq: Two times a day (BID) | ORAL | Status: DC

## 2012-04-12 NOTE — ED Notes (Signed)
Pt c/o lower back pain that shoots down left hip area. Reports pain has been present for 5 months and has gotten worse.

## 2012-04-12 NOTE — ED Provider Notes (Signed)
Medical screening examination/treatment/procedure(s) were performed by non-physician practitioner and as supervising physician I was immediately available for consultation/collaboration. Akif Weldy, MD, FACEP   Keilani Terrance L Alexsis Kathman, MD 04/12/12 1637 

## 2012-04-12 NOTE — ED Provider Notes (Signed)
History     CSN: [id number]  Arrival date & time 04/12/12  1154   First MD Initiated Contact with Patient 04/12/12 1219      Chief Complaint  Patient presents with  . Back Pain    (Consider location/radiation/quality/duration/timing/severity/associated sxs/prior treatment) HPI Comments: Patient presents today with a chief complaint of back pain.  She reports that the pain is located across her lower back.  She states that at time the pain radiates down her left leg.   Pain worse with ROM of lower back.  Pain has been present for the past 5 months.  She reports that she has taken Percocet in the past for this pain, but did not feel that it helped.  Patient is a 26 y.o. female presenting with back pain. The history is provided by the patient.  Back Pain  The problem occurs constantly. The problem has been gradually worsening. The pain is associated with no known injury. The pain is present in the lumbar spine. The symptoms are aggravated by bending and twisting. Pertinent negatives include no fever, no numbness, no abdominal pain, no bowel incontinence, no perianal numbness, no bladder incontinence, no dysuria, no pelvic pain, no paresthesias, no paresis, no tingling and no weakness.    Past Medical History  Diagnosis Date  . No pertinent past medical history     Past Surgical History  Procedure Date  . Cesarean section     Family History  Problem Relation Age of Onset  . Adopted: Yes  . Other Neg Hx     History  Substance Use Topics  . Smoking status: Current Every Day Smoker -- 1.5 packs/day    Types: Cigarettes  . Smokeless tobacco: Not on file  . Alcohol Use: No    OB History    Grav Para Term Preterm Abortions TAB SAB Ect Mult Living   2 1 1  1  1   1       Review of Systems  Constitutional: Negative for fever and chills.  Gastrointestinal: Negative for nausea, vomiting, abdominal pain and bowel incontinence.  Genitourinary: Negative for bladder incontinence,  dysuria, urgency, frequency, difficulty urinating and pelvic pain.       No urinary retention No bowel or bladder incontinence  Musculoskeletal: Positive for back pain.  Neurological: Negative for tingling, weakness, numbness and paresthesias.    Allergies  Amoxicillin; Amoxapine and related; and Clindamycin/lincomycin  Home Medications   Current Outpatient Rx  Name  Route  Sig  Dispense  Refill  . DIPHENHYDRAMINE-APAP (SLEEP) 25-500 MG PO TABS   Oral   Take 2 tablets by mouth at bedtime as needed. backpain/sleep         . OXYCODONE-ACETAMINOPHEN 5-325 MG PO TABS   Oral   Take 1-2 tablets by mouth every 6 (six) hours as needed. pain           BP 118/74  Pulse 85  Temp 98.9 F (37.2 C) (Oral)  Resp 18  SpO2 100%  Physical Exam  Nursing note and vitals reviewed. Constitutional: She is oriented to person, place, and time. She appears well-developed and well-nourished. No distress.  HENT:  Head: Normocephalic and atraumatic.  Mouth/Throat: Oropharynx is clear and moist.  Neck: Normal range of motion and full passive range of motion without pain. Neck supple. No spinous process tenderness and no muscular tenderness present. No rigidity. Normal range of motion present.  Cardiovascular: Normal rate, regular rhythm and intact distal pulses.   Pulmonary/Chest: Effort normal and breath  sounds normal.  Musculoskeletal:       Cervical back: She exhibits normal range of motion, no tenderness, no bony tenderness and no pain.       Thoracic back: She exhibits no tenderness, no bony tenderness and no pain.       Lumbar back: She exhibits tenderness, bony tenderness and pain. She exhibits no spasm and normal pulse.       Bilateral lower extremities nontender without color change, baseline range of motion of extremities with intact distal pulses..  Pt has increased pain w ROM of lumbar spine. Pain w ambulation, no sign of ataxia.  Neurological: She is alert and oriented to person,  place, and time. She has normal strength and normal reflexes. No sensory deficit. Gait (no ataxia, slowed and hunched d/t pain ) abnormal.       Sensation at baseline for light touch in all 4 distal extremities, motor symmetric & bilateral 5/5 (hips: abduction, adduction, flexion; knee: flexion & extension; foot: dorsiflexion, plantar flexion, toes: dorsi flexion) Patellar & ankle reflexes intact.   Skin: Skin is warm and dry. No rash noted. She is not diaphoretic. No erythema. No pallor.  Psychiatric: She has a normal mood and affect.    ED Course  Procedures (including critical care time)  Labs Reviewed - No data to display No results found.   No diagnosis found.    MDM  Patient with back pain.  No neurological deficits and normal neuro exam.  Patient can walk but states is painful.  No loss of bowel or bladder control.  No concern for cauda equina.  No fever, night sweats, weight loss, h/o cancer, IVDU.  RICE protocol discussed with patient.  Patient given prescription for Robaxin and instructed to take Ibuprofen.        Pascal Lux Wilber, PA-C 04/12/12 [phone]

## 2012-06-10 ENCOUNTER — Emergency Department (HOSPITAL_COMMUNITY)
Admission: EM | Admit: 2012-06-10 | Discharge: 2012-06-10 | Disposition: A | Discharge status: Home / Self Care | Payer: Self-pay | Attending: Emergency Medicine | Admitting: Emergency Medicine

## 2012-06-10 ENCOUNTER — Encounter (HOSPITAL_COMMUNITY): Payer: Self-pay

## 2012-06-10 DIAGNOSIS — R1032 Left lower quadrant pain: Secondary | ICD-10-CM | POA: Insufficient documentation

## 2012-06-10 DIAGNOSIS — R109 Unspecified abdominal pain: Secondary | ICD-10-CM

## 2012-06-10 DIAGNOSIS — R51 Headache: Secondary | ICD-10-CM | POA: Insufficient documentation

## 2012-06-10 DIAGNOSIS — F17200 Nicotine dependence, unspecified, uncomplicated: Secondary | ICD-10-CM | POA: Insufficient documentation

## 2012-06-10 LAB — URINE MICROSCOPIC-ADD ON

## 2012-06-10 LAB — URINALYSIS, ROUTINE W REFLEX MICROSCOPIC
Bilirubin Urine: NEGATIVE
Glucose, UA: NEGATIVE mg/dL
Hgb urine dipstick: NEGATIVE
Ketones, ur: NEGATIVE mg/dL
Nitrite: NEGATIVE
Protein, ur: NEGATIVE mg/dL
Specific Gravity, Urine: 1.018 (ref 1.005–1.030)
Urobilinogen, UA: 0.2 mg/dL (ref 0.0–1.0)
pH: 7.5 (ref 5.0–8.0)

## 2012-06-10 LAB — WET PREP, GENITAL
Trich, Wet Prep: NONE SEEN
Yeast Wet Prep HPF POC: NONE SEEN

## 2012-06-10 MED ORDER — IBUPROFEN 200 MG PO TABS
600.0000 mg | ORAL_TABLET | Freq: Once | ORAL | Status: AC
  Administered 2012-06-10: 600 mg via ORAL
  Filled 2012-06-10: qty 3

## 2012-06-10 NOTE — ED Notes (Signed)
Patient c/o LLQ and left flank pain, headache, and dysuria x 2 weeks.

## 2012-06-10 NOTE — ED Notes (Signed)
Per pt: left lower abdominal pain began about 12 days ago.  Pt has mirena in in place for past three years- not having periods.  Pt states that she had a large "mucous plug" after intercourse around the same time the pain started.

## 2012-06-11 LAB — GC/CHLAMYDIA PROBE AMP
CT Probe RNA: NEGATIVE
GC Probe RNA: NEGATIVE

## 2012-06-16 NOTE — ED Provider Notes (Signed)
History    26yf with crampy LLQ pain. Gradual onset almost 2w ago. Persistent. No appreciable exacerbating or relieving factors. Dysuria. No unusual vaginal bleeding or discharge. No fever or chills. No hx of kidney stones. Also HA for past 3d. Atraumatic. No neck pain or stiffness. No neurological complaints.   CSN: [id number]  Arrival date & time 06/10/12  0802   First MD Initiated Contact with Patient 06/10/12 [phone]      Chief Complaint  Patient presents with  . Headache  . Abdominal Pain    (Consider location/radiation/quality/duration/timing/severity/associated sxs/prior treatment) HPI  Past Medical History  Diagnosis Date  . No pertinent past medical history     Past Surgical History  Procedure Date  . Cesarean section     Family History  Problem Relation Age of Onset  . Adopted: Yes  . Other Neg Hx     History  Substance Use Topics  . Smoking status: Current Every Day Smoker -- 1.5 packs/day    Types: Cigarettes  . Smokeless tobacco: Never Used  . Alcohol Use: No    OB History    Grav Para Term Preterm Abortions TAB SAB Ect Mult Living   2 1 1  1  1   1       Review of Systems  All systems reviewed and negative, other than as noted in HPI.   Allergies  Amoxicillin; Amoxapine and related; Clindamycin/lincomycin; and Metronidazole  Home Medications   Current Outpatient Rx  Name  Route  Sig  Dispense  Refill  . ACETAMINOPHEN 500 MG PO TABS   Oral   Take 500 mg by mouth every 6 (six) hours as needed. pain           BP 104/67  Pulse 69  Temp 98.4 F (36.9 C) (Oral)  Resp 18  SpO2 99%  Physical Exam  Nursing note and vitals reviewed. Constitutional: She is oriented to person, place, and time. She appears well-developed and well-nourished. No distress.  HENT:  Head: Normocephalic and atraumatic.  Eyes: Conjunctivae normal are normal. Right eye exhibits no discharge. Left eye exhibits no discharge.  Neck: Neck supple.       No nuchal  rigidity  Cardiovascular: Normal rate, regular rhythm and normal heart sounds.  Exam reveals no gallop and no friction rub.   No murmur heard. Pulmonary/Chest: Effort normal and breath sounds normal. No respiratory distress.  Abdominal: Soft. She exhibits no distension. There is no tenderness.  Genitourinary:       Normal external female genitalia. No bleeding or significant discharge in vaginal vault. No cervical lesions. No cmt. No adnexal mass or tenderness appreciated.  Musculoskeletal: She exhibits no edema and no tenderness.  Neurological: She is alert and oriented to person, place, and time. No cranial nerve deficit. She exhibits normal muscle tone. Coordination normal.  Skin: Skin is warm and dry.  Psychiatric: She has a normal mood and affect. Her behavior is normal. Thought content normal.    ED Course  Procedures (including critical care time)  Labs Reviewed  URINALYSIS, ROUTINE W REFLEX MICROSCOPIC - Abnormal; Notable for the following:    Leukocytes, UA TRACE (*)     All other components within normal limits  URINE MICROSCOPIC-ADD ON - Abnormal; Notable for the following:    Squamous Epithelial / LPF FEW (*)     Bacteria, UA FEW (*)     All other components within normal limits  WET PREP, GENITAL - Abnormal; Notable for the following:  Clue Cells Wet Prep HPF POC RARE (*)     WBC, Wet Prep HPF POC RARE (*)     All other components within normal limits  GC/CHLAMYDIA PROBE AMP  LAB REPORT - SCANNED   No results found.   1. Abdominal cramps   2. Headache       MDM  26yf with HA. Suspect primary HA. Consider emergent secondary causes such as bleed, infectious or mass but doubt. There is no history of trauma. Pt has a nonfocal neurological exam. Afebrile and neck supple. No use of blood thinning medication. Consider ocular etiology such as acute angle closure glaucoma but doubt. Pt denies acute change in visual acuity and eye exam unremarkable.Doubt temporal  arteritis given age, no temporal tenderness and temporal artery pulsations palpable. Doubt CO poisoning. No contacts with similar symptoms. Doubt venous thrombosis. Doubt carotid or vertebral arteries dissection. Abdomen is benign. Pelvic exam unremarkable. Symptoms improved with meds. Feel that can be safely discharged, but strict return precautions discussed. Outpt fu.         Raeford Razor, MD 06/16/12 [phone]

## 2012-07-26 ENCOUNTER — Encounter (HOSPITAL_COMMUNITY): Payer: Self-pay | Admitting: *Deleted

## 2012-07-26 ENCOUNTER — Emergency Department (HOSPITAL_COMMUNITY)
Admission: EM | Admit: 2012-07-26 | Discharge: 2012-07-26 | Disposition: A | Discharge status: Home / Self Care | Payer: Self-pay | Attending: Emergency Medicine | Admitting: Emergency Medicine

## 2012-07-26 ENCOUNTER — Emergency Department (HOSPITAL_COMMUNITY): Payer: Self-pay

## 2012-07-26 DIAGNOSIS — R51 Headache: Secondary | ICD-10-CM | POA: Insufficient documentation

## 2012-07-26 DIAGNOSIS — J029 Acute pharyngitis, unspecified: Secondary | ICD-10-CM | POA: Insufficient documentation

## 2012-07-26 DIAGNOSIS — R1110 Vomiting, unspecified: Secondary | ICD-10-CM | POA: Insufficient documentation

## 2012-07-26 DIAGNOSIS — M549 Dorsalgia, unspecified: Secondary | ICD-10-CM | POA: Insufficient documentation

## 2012-07-26 DIAGNOSIS — R109 Unspecified abdominal pain: Secondary | ICD-10-CM | POA: Insufficient documentation

## 2012-07-26 DIAGNOSIS — R059 Cough, unspecified: Secondary | ICD-10-CM | POA: Insufficient documentation

## 2012-07-26 DIAGNOSIS — N390 Urinary tract infection, site not specified: Secondary | ICD-10-CM | POA: Insufficient documentation

## 2012-07-26 DIAGNOSIS — F17200 Nicotine dependence, unspecified, uncomplicated: Secondary | ICD-10-CM | POA: Insufficient documentation

## 2012-07-26 LAB — PREGNANCY, URINE: Preg Test, Ur: NEGATIVE

## 2012-07-26 LAB — URINALYSIS, ROUTINE W REFLEX MICROSCOPIC
Glucose, UA: NEGATIVE mg/dL
Nitrite: NEGATIVE
pH: 6 (ref 5.0–8.0)

## 2012-07-26 LAB — URINE MICROSCOPIC-ADD ON

## 2012-07-26 MED ORDER — KETOROLAC TROMETHAMINE 30 MG/ML IJ SOLN
30.0000 mg | Freq: Once | INTRAMUSCULAR | Status: AC
  Administered 2012-07-26: 30 mg via INTRAVENOUS
  Filled 2012-07-26: qty 1

## 2012-07-26 MED ORDER — METOCLOPRAMIDE HCL 5 MG/ML IJ SOLN
10.0000 mg | Freq: Once | INTRAMUSCULAR | Status: AC
  Administered 2012-07-26: 10 mg via INTRAVENOUS
  Filled 2012-07-26: qty 2

## 2012-07-26 MED ORDER — SULFAMETHOXAZOLE-TRIMETHOPRIM 800-160 MG PO TABS: 1.0000 | ORAL_TABLET | Freq: Two times a day (BID) | ORAL | Status: DC

## 2012-07-26 MED ORDER — DEXAMETHASONE SODIUM PHOSPHATE 10 MG/ML IJ SOLN
10.0000 mg | Freq: Once | INTRAMUSCULAR | Status: AC
  Administered 2012-07-26: 10 mg via INTRAVENOUS
  Filled 2012-07-26: qty 1

## 2012-07-26 NOTE — ED Provider Notes (Signed)
History     CSN: [id number]  Arrival date & time 07/26/12  1851   First MD Initiated Contact with Patient 07/26/12 1901      Chief Complaint  Patient presents with  . Headache  . Emesis  . Abdominal Pain    (Consider location/radiation/quality/duration/timing/severity/associated sxs/prior treatment) HPI Comments: Pt comes in with 3 separate complaints:pt c/o lower abdominal pain that has been going on for the last 3 years since she had the mirena put in,pt has not seen her ob because she can't afford it but [facility]:pt states that she has had imaging and has not gottent any answers:pt states that there is no new symptoms today:pt denies vaginal discharge or dysuria:pt states that she is also having a headache intermittently:pt states that she had it 4 days ago and she went to sleep and it was gone:pt states that she came in today because it came back:pt states that she has had one dose of vomiting:pt states that she has also had a sore throat that started after her cough resolved:pt denies fever:pt states that she has also been having more back pain then normal  The history is provided by the patient. No language interpreter was used.    Past Medical History  Diagnosis Date  . No pertinent past medical history     Past Surgical History  Procedure Laterality Date  . Cesarean section      Family History  Problem Relation Age of Onset  . Adopted: Yes  . Other Neg Hx     History  Substance Use Topics  . Smoking status: Current Every Day Smoker -- 1.50 packs/day    Types: Cigarettes  . Smokeless tobacco: Never Used  . Alcohol Use: No    OB History   Grav Para Term Preterm Abortions TAB SAB Ect Mult Living   2 1 1  1  1   1       Review of Systems  Constitutional: Negative.   Respiratory: Negative.   Cardiovascular: Negative.     Allergies  Amoxicillin; Amoxapine and related; Clindamycin/lincomycin; and Metronidazole  Home  Medications   Current Outpatient Rx  Name  Route  Sig  Dispense  Refill  . acetaminophen (TYLENOL) 500 MG tablet   Oral   Take 500 mg by mouth every 6 (six) hours as needed. pain           BP 122/70  Pulse 93  Temp(Src) 98.8 F (37.1 C) (Oral)  Resp 18  SpO2 100%  Physical Exam  Nursing note and vitals reviewed. Constitutional: She is oriented to person, place, and time.  HENT:  Head: Normocephalic and atraumatic.  Right Ear: External ear normal.  Left Ear: External ear normal.  Eyes: Conjunctivae and EOM are normal. Pupils are equal, round, and reactive to light.  Neck: Normal range of motion.  Cardiovascular: Normal rate and regular rhythm.   Pulmonary/Chest: Effort normal and breath sounds normal.  Abdominal: Soft. Bowel sounds are normal. There is no tenderness.  Musculoskeletal: Normal range of motion.  Neurological: She is alert and oriented to person, place, and time.  Skin: Skin is warm and dry.  Psychiatric: She has a normal mood and affect.    ED Course  Procedures (including critical care time)  Labs Reviewed  URINALYSIS, ROUTINE W REFLEX MICROSCOPIC - Abnormal; Notable for the following:    Color, Urine AMBER (*)    APPearance CLOUDY (*)    Specific Gravity, Urine 1.036 (*)  Bilirubin Urine SMALL (*)    Ketones, ur TRACE (*)    Leukocytes, UA TRACE (*)    All other components within normal limits  URINE MICROSCOPIC-ADD ON - Abnormal; Notable for the following:    Crystals CA OXALATE CRYSTALS (*)    All other components within normal limits  PREGNANCY, URINE   No results found.   1. Headache   2. Sorethroat   3. UTI (lower urinary tract infection)       MDM  Pt has history of similar headache:no concerning symptoms associated with the headache:pharynigitis likely viral:pt treated with the toradol decadron and reglan:pt left with ct pending        Teressa Lower, NP 07/26/12 2019

## 2012-07-26 NOTE — ED Provider Notes (Signed)
Medical screening examination/treatment/procedure(s) were performed by non-physician practitioner and as supervising physician I was immediately available for consultation/collaboration.   Celene Kras, MD 07/26/12 2025

## 2012-07-26 NOTE — ED Notes (Signed)
Pt states for the past week she's had headaches, no hx of migraines, states also having sore throat, abdominal pain, started vomiting today.

## 2012-07-26 NOTE — ED Provider Notes (Signed)
Pt received from [place], PA-C.  US abdomen negative.  Results discussed w/ pt.  She was prescribed an abx for UTI. She has a PCP and gynecologist to f/u with.  Return precautions discussed. 10:24 PM   Otilio Miu, PA-C 07/26/12 2224

## 2012-07-27 NOTE — ED Provider Notes (Signed)
Medical screening examination/treatment/procedure(s) were performed by non-physician practitioner and as supervising physician I was immediately available for consultation/collaboration.    Jon R Knapp, MD 07/27/12 1510 

## 2012-09-14 ENCOUNTER — Emergency Department (HOSPITAL_COMMUNITY)
Admission: EM | Admit: 2012-09-14 | Discharge: 2012-09-14 | Disposition: A | Discharge status: Home / Self Care | Payer: No Typology Code available for payment source | Attending: Emergency Medicine | Admitting: Emergency Medicine

## 2012-09-14 ENCOUNTER — Encounter (HOSPITAL_COMMUNITY): Payer: Self-pay | Admitting: Emergency Medicine

## 2012-09-14 DIAGNOSIS — M545 Low back pain, unspecified: Secondary | ICD-10-CM | POA: Insufficient documentation

## 2012-09-14 DIAGNOSIS — F17200 Nicotine dependence, unspecified, uncomplicated: Secondary | ICD-10-CM | POA: Insufficient documentation

## 2012-09-14 DIAGNOSIS — R509 Fever, unspecified: Secondary | ICD-10-CM | POA: Insufficient documentation

## 2012-09-14 DIAGNOSIS — G8929 Other chronic pain: Secondary | ICD-10-CM | POA: Insufficient documentation

## 2012-09-14 MED ORDER — TRAMADOL HCL 50 MG PO TABS: 50.0000 mg | ORAL_TABLET | Freq: Four times a day (QID) | ORAL | Status: DC | PRN

## 2012-09-14 MED ORDER — TRAMADOL HCL 50 MG PO TABS
50.0000 mg | ORAL_TABLET | Freq: Once | ORAL | Status: AC
  Administered 2012-09-14: 50 mg via ORAL
  Filled 2012-09-14: qty 1

## 2012-09-14 NOTE — ED Provider Notes (Signed)
History    This chart was scribed for non-physician practitioner Wynetta Emery, PA-C working with Celene Kras, MD by Gerlean Ren, ED Scribe. This patient was seen in room WTR6/WTR6 and the patient's care was started at 3:09 PM.    CSN: [id number]  Arrival date & time 09/14/12  1331   First MD Initiated Contact with Patient 09/14/12 1507      No chief complaint on file.    The history is provided by the patient. No language interpreter was used.  [name] is a 27 y.o. female who presents to the Emergency Department complaining of left lower back pain with associated left hip pain that she states is "popping" out while ambulating.  Pt states back pain is chronic and has not worsened or changed location.  Pain not improved by OCM.  Pt denies any recent falls, injuries, or traumas.  Pt denies emesis, abdominal pain, fever, chest pain, dyspnea, dysuria, incontinence of bowel or bladder, saddle paraesthesias, history of IV drug use or cancer.  Past Medical History  Diagnosis Date  . No pertinent past medical history     Past Surgical History  Procedure Laterality Date  . Cesarean section      Family History  Problem Relation Age of Onset  . Adopted: Yes  . Other Neg Hx     History  Substance Use Topics  . Smoking status: Current Every Day Smoker -- 1.50 packs/day    Types: Cigarettes  . Smokeless tobacco: Never Used  . Alcohol Use: No    OB History   Grav Para Term Preterm Abortions TAB SAB Ect Mult Living   2 1 1  1  1   1       Review of Systems  Constitutional: Positive for fever.  Respiratory: Negative for shortness of breath.   Cardiovascular: Negative for chest pain.  Gastrointestinal: Negative for nausea, vomiting and abdominal pain.  Genitourinary: Negative for dysuria.  Musculoskeletal: Positive for back pain (chronic).  Neurological: Negative for headaches.  All other systems reviewed and are negative.    Allergies  Amoxicillin; Amoxapine and  related; Clindamycin/lincomycin; and Metronidazole  Home Medications   Current Outpatient Rx  Name  Route  Sig  Dispense  Refill  . acetaminophen (TYLENOL) 500 MG tablet   Oral   Take 500 mg by mouth every 6 (six) hours as needed. pain         . sulfamethoxazole-trimethoprim (SEPTRA DS) 800-160 MG per tablet   Oral   Take 1 tablet by mouth every 12 (twelve) hours.   6 tablet   0     BP 127/74  Pulse 87  Temp(Src) 98 F (36.7 C) (Oral)  SpO2 100%  Physical Exam  Nursing note and vitals reviewed. Constitutional: She is oriented to person, place, and time. She appears well-developed and well-nourished. No distress.  HENT:  Head: Normocephalic.  Eyes: Conjunctivae and EOM are normal.  Cardiovascular: Normal rate, regular rhythm and normal heart sounds.   Pulmonary/Chest: Effort normal and breath sounds normal. No stridor. No respiratory distress. She has no wheezes. She has no rales.  Abdominal: Soft. Bowel sounds are normal. She exhibits no distension. There is no tenderness.  Genitourinary:  No CVA tenderness bilaterally.  Musculoskeletal: Normal range of motion.  Neurological: She is alert and oriented to person, place, and time.  Sensation normal in bilateral lower extremities.  Strength 5/5 x4 extremities.  Skin: Skin is warm and dry.  Psychiatric: She has a normal mood  and affect.    ED Course  Procedures (including critical care time) DIAGNOSTIC STUDIES: Oxygen Saturation is 100% on room air, normal by my interpretation.    COORDINATION OF CARE: 3:15 PM- Discussed with pt that establishing a relationship with PCP is necessary to control chronic conditions.  Informed pt that I can write short-term pain medicine.  Pt verbalizes understanding and agrees with plan.    1. Chronic back pain       MDM   [name] is a 27 y.o. female with back pain.  No neurological deficits and normal neuro exam.  Patient can walk but states is painful.  No loss of bowel  or bladder control.  No concern for cauda equina.  No fever, night sweats, weight loss, h/o cancer, IVDU.  RICE protocol and pain medicine indicated and discussed with patient.    Filed Vitals:   09/14/12 1412  BP: 127/74  Pulse: 87  Temp: 98 F (36.7 C)  TempSrc: Oral  SpO2: 100%     Pt verbalized understanding and agrees with care plan. Outpatient follow-up and return precautions given.    New Prescriptions   TRAMADOL (ULTRAM) 50 MG TABLET    Take 1 tablet (50 mg total) by mouth every 6 (six) hours as needed for pain.    I personally performed the services described in this documentation, which was scribed in my presence. The recorded information has been reviewed and is accurate.   Wynetta Emery, PA-C 09/14/12 1529

## 2012-09-14 NOTE — ED Provider Notes (Signed)
Medical screening examination/treatment/procedure(s) were performed by non-physician practitioner and as supervising physician I was immediately available for consultation/collaboration.    Celene Kras, MD 09/14/12 2232

## 2012-09-14 NOTE — ED Notes (Addendum)
Pt sts that she is having a eczema out break and looking for medication for this. Pt also has had a hx of back pain and the pain has increased since last week. No distress noted. Steady gait noted. Pt has been seen in the ED for the same back pain (she has been told she has chronic back pain) and provided with Rx.

## 2012-10-03 ENCOUNTER — Inpatient Hospital Stay (HOSPITAL_COMMUNITY): Payer: No Typology Code available for payment source

## 2012-10-03 ENCOUNTER — Inpatient Hospital Stay (HOSPITAL_COMMUNITY)
Admission: AD | Admit: 2012-10-03 | Discharge: 2012-10-04 | Disposition: A | Discharge status: Home / Self Care | Payer: No Typology Code available for payment source | Source: Ambulatory Visit | Attending: Obstetrics & Gynecology | Admitting: Obstetrics & Gynecology

## 2012-10-03 ENCOUNTER — Encounter (HOSPITAL_COMMUNITY): Payer: Self-pay | Admitting: *Deleted

## 2012-10-03 DIAGNOSIS — A499 Bacterial infection, unspecified: Secondary | ICD-10-CM | POA: Insufficient documentation

## 2012-10-03 DIAGNOSIS — Z975 Presence of (intrauterine) contraceptive device: Secondary | ICD-10-CM | POA: Insufficient documentation

## 2012-10-03 DIAGNOSIS — N938 Other specified abnormal uterine and vaginal bleeding: Secondary | ICD-10-CM | POA: Insufficient documentation

## 2012-10-03 DIAGNOSIS — R109 Unspecified abdominal pain: Secondary | ICD-10-CM | POA: Insufficient documentation

## 2012-10-03 DIAGNOSIS — N760 Acute vaginitis: Secondary | ICD-10-CM | POA: Insufficient documentation

## 2012-10-03 DIAGNOSIS — R102 Pelvic and perineal pain: Secondary | ICD-10-CM

## 2012-10-03 DIAGNOSIS — N949 Unspecified condition associated with female genital organs and menstrual cycle: Secondary | ICD-10-CM | POA: Insufficient documentation

## 2012-10-03 DIAGNOSIS — B9689 Other specified bacterial agents as the cause of diseases classified elsewhere: Secondary | ICD-10-CM | POA: Insufficient documentation

## 2012-10-03 LAB — URINALYSIS, ROUTINE W REFLEX MICROSCOPIC
Leukocytes, UA: NEGATIVE
Protein, ur: NEGATIVE mg/dL
Specific Gravity, Urine: 1.015 (ref 1.005–1.030)
Urobilinogen, UA: 0.2 mg/dL (ref 0.0–1.0)

## 2012-10-03 LAB — HCG, QUANTITATIVE, PREGNANCY: hCG, Beta Chain, Quant, S: 1 m[IU]/mL (ref <5)

## 2012-10-03 LAB — POCT PREGNANCY, URINE: Preg Test, Ur: NEGATIVE

## 2012-10-03 NOTE — MAU Note (Signed)
Pt has Mirena IUD, dx with BAV last week, prescribed flagyl, not taking due to allergy.  Today noticed light blood on tissue, bleeding became darker tonight and having lower abd cramping.

## 2012-10-03 NOTE — MAU Provider Note (Signed)
History     CSN: [id number]  Arrival date and time: 10/03/12 2027   None     Chief Complaint  Patient presents with  . Vaginal Bleeding  . Back Pain   HPI  Pt has Mirena IUD, dx with bacterial vaginosis, prescribed flagyl, not taking due to allergy. Today noticed light blood on tissue, bleeding became darker tonight and having lower abd cramping.  This is first cycle since having the Mirena x 3 years.     Past Medical History  Diagnosis Date  . No pertinent past medical history   . Medical history non-contributory     Past Surgical History  Procedure Laterality Date  . Cesarean section    . Wisdom tooth extraction      Family History  Problem Relation Age of Onset  . Adopted: Yes  . Other Neg Hx     History  Substance Use Topics  . Smoking status: Former Smoker -- 1.50 packs/day    Types: Cigarettes  . Smokeless tobacco: Never Used  . Alcohol Use: No    Allergies:  Allergies  Allergen Reactions  . Amoxicillin Other (See Comments)    Skin peeling  . Amoxapine And Related   . Clindamycin/Lincomycin Other (See Comments)    Skin peels on perineum, lower pubic area, and groin. Same reaction as  amoxicillin  . Metronidazole     Skin peels    Prescriptions prior to admission  Medication Sig Dispense Refill  . acetaminophen (TYLENOL) 500 MG tablet Take 500 mg by mouth every 6 (six) hours as needed. pain      . sulfamethoxazole-trimethoprim (SEPTRA DS) 800-160 MG per tablet Take 1 tablet by mouth every 12 (twelve) hours.  6 tablet  0  . traMADol (ULTRAM) 50 MG tablet Take 1 tablet (50 mg total) by mouth every 6 (six) hours as needed for pain.  10 tablet  0    Review of Systems  Genitourinary: Negative for dysuria, urgency, frequency and hematuria.       Vaginal bleeding  Musculoskeletal: Positive for back pain (left sided).  All other systems reviewed and are negative.   Physical Exam   Blood pressure 123/89, pulse 85, temperature 98.5 F (36.9 C),  temperature source Oral, resp. rate 16, height 5\' 5"  (1.651 m), weight 78.291 kg (172 lb 9.6 oz).  Physical Exam  Constitutional: She is oriented to person, place, and time. She appears well-developed and well-nourished. No distress.  HENT:  Head: Normocephalic.  Eyes: Pupils are equal, round, and reactive to light.  Neck: Normal range of motion. Neck supple.  Cardiovascular: Normal rate, regular rhythm and normal heart sounds.   Respiratory: Effort normal and breath sounds normal.  GI: Soft. There is no tenderness.  Genitourinary: Vaginal discharge (scant bleeding) found.  Negative cervical motion tenderness; IUD string in place  Neurological: She is alert and oriented to person, place, and time. She has normal reflexes.  Skin: Skin is warm and dry.    MAU Course  Procedures Results for orders placed during the hospital encounter of 10/03/12 (from the past 24 hour(s))  URINALYSIS, ROUTINE W REFLEX MICROSCOPIC     Status: Abnormal   Collection Time    10/03/12  8:40 PM      Result Value Range   Color, Urine YELLOW  YELLOW   APPearance CLEAR  CLEAR   Specific Gravity, Urine 1.015  1.005 - 1.030   pH 7.0  5.0 - 8.0   Glucose, UA NEGATIVE  NEGATIVE mg/dL  Hgb urine dipstick TRACE (*) NEGATIVE   Bilirubin Urine NEGATIVE  NEGATIVE   Ketones, ur NEGATIVE  NEGATIVE mg/dL   Protein, ur NEGATIVE  NEGATIVE mg/dL   Urobilinogen, UA 0.2  0.0 - 1.0 mg/dL   Nitrite NEGATIVE  NEGATIVE   Leukocytes, UA NEGATIVE  NEGATIVE  URINE MICROSCOPIC-ADD ON     Status: Abnormal   Collection Time    10/03/12  8:40 PM      Result Value Range   Squamous Epithelial / LPF FEW (*) RARE   WBC, UA 0-2  <3 WBC/hpf   RBC / HPF 0-2  <3 RBC/hpf   Bacteria, UA FEW (*) RARE  POCT PREGNANCY, URINE     Status: None   Collection Time    10/03/12  8:53 PM      Result Value Range   Preg Test, Ur NEGATIVE  NEGATIVE  HCG, QUANTITATIVE, PREGNANCY     Status: None   Collection Time    10/03/12  9:35 PM       Result Value Range   hCG, Beta Chain, Quant, S 1  <5 mIU/mL    Ultrasound: IMPRESSION:  No acute focal abnormality. IUD in place.  Assessment and Plan  Pelvic Pain  Plan: Provided reassurance; pain may be related to menses Ibuprofen for pain Follow-up prn  [facility] 10/03/2012, 11:15 PM

## 2012-10-04 DIAGNOSIS — N949 Unspecified condition associated with female genital organs and menstrual cycle: Secondary | ICD-10-CM

## 2012-10-05 LAB — GC/CHLAMYDIA PROBE AMP: GC Probe RNA: NEGATIVE

## 2013-01-08 ENCOUNTER — Inpatient Hospital Stay (HOSPITAL_COMMUNITY)
Admission: AD | Admit: 2013-01-08 | Discharge: 2013-01-08 | Disposition: A | Discharge status: Home / Self Care | Payer: No Typology Code available for payment source | Source: Ambulatory Visit | Attending: Obstetrics & Gynecology | Admitting: Obstetrics & Gynecology

## 2013-01-08 ENCOUNTER — Encounter (HOSPITAL_COMMUNITY): Payer: Self-pay | Admitting: *Deleted

## 2013-01-08 DIAGNOSIS — N898 Other specified noninflammatory disorders of vagina: Secondary | ICD-10-CM | POA: Insufficient documentation

## 2013-01-08 DIAGNOSIS — F419 Anxiety disorder, unspecified: Secondary | ICD-10-CM

## 2013-01-08 DIAGNOSIS — M549 Dorsalgia, unspecified: Secondary | ICD-10-CM | POA: Insufficient documentation

## 2013-01-08 DIAGNOSIS — N949 Unspecified condition associated with female genital organs and menstrual cycle: Secondary | ICD-10-CM | POA: Insufficient documentation

## 2013-01-08 DIAGNOSIS — R42 Dizziness and giddiness: Secondary | ICD-10-CM | POA: Insufficient documentation

## 2013-01-08 DIAGNOSIS — G8929 Other chronic pain: Secondary | ICD-10-CM | POA: Insufficient documentation

## 2013-01-08 LAB — CBC
HCT: 37.7 % (ref 36.0–46.0)
Hemoglobin: 13 g/dL (ref 12.0–15.0)
MCH: 31.6 pg (ref 26.0–34.0)
MCHC: 34.5 g/dL (ref 30.0–36.0)
MCV: 91.5 fL (ref 78.0–100.0)

## 2013-01-08 LAB — COMPREHENSIVE METABOLIC PANEL
Alkaline Phosphatase: 59 U/L (ref 39–117)
BUN: 8 mg/dL (ref 6–23)
GFR calc Af Amer: >90 mL/min (ref >90)
Glucose, Bld: 92 mg/dL (ref 70–99)
Potassium: 3.5 mEq/L (ref 3.5–5.1)
Total Protein: 7.8 g/dL (ref 6.0–8.3)

## 2013-01-08 LAB — WET PREP, GENITAL
Trich, Wet Prep: NONE SEEN
Yeast Wet Prep HPF POC: NONE SEEN

## 2013-01-08 LAB — URINALYSIS, ROUTINE W REFLEX MICROSCOPIC
Leukocytes, UA: NEGATIVE
Nitrite: NEGATIVE
Specific Gravity, Urine: 1.02 (ref 1.005–1.030)
pH: 7 (ref 5.0–8.0)

## 2013-01-08 LAB — POCT PREGNANCY, URINE: Preg Test, Ur: NEGATIVE

## 2013-01-08 NOTE — MAU Provider Note (Signed)
History     CSN: [id number]  Arrival date and time: 01/08/13 1135   None     Chief Complaint  Patient presents with  . Nausea  . Vaginal Discharge  . Back Pain     HPI Comments: [name] is a 27 y.o. G2P1011 who presents to day for vaginal discharge, dizziness, depression/anxiety, and back pain.  Her discharge began two weeks ago and has become thicker, whiter, and malodorous since that time. She has had a Mirena IUD in place for 4 years. She has found no aggravating or alleviating factors.  She has a new partner of two months. She states being monogamous, though she is uncertain if he is monogamous.  Her dizziness began two weeks ago.  She reports that her symptoms are often related to her use of muscle relaxants and anti seizure medications.  She denies changes in vision and syncope. She has a long course of back pain for which she is being treated by an orthopedist.  MRI showed slipped disks in L5 for which she is scheduled for prednisone injections.  She has a history of depression and anxiety for which she is being treated at [facility].  She reports being prescribed Zoloft, but has had financial difficulties in obtaining the prescription.            Past Medical History  Diagnosis Date  . No pertinent past medical history   . Medical history non-contributory     Past Surgical History  Procedure Laterality Date  . Cesarean section    . Wisdom tooth extraction      Family History  Problem Relation Age of Onset  . Adopted: Yes  . Other Neg Hx     History  Substance Use Topics  . Smoking status: Former Smoker -- 1.50 packs/day    Types: Cigarettes  . Smokeless tobacco: Never Used  . Alcohol Use: No    Allergies:  Allergies  Allergen Reactions  . Amoxicillin Other (See Comments)    Skin peeling  . Amoxapine And Related   . Clindamycin/Lincomycin Other (See Comments)    Skin peels on perineum, lower pubic area, and groin. Same reaction as  amoxicillin  .  Metronidazole     Skin peels    Prescriptions prior to admission  Medication Sig Dispense Refill  . methocarbamol (ROBAXIN) 500 MG tablet Take 500 mg by mouth 4 (four) times daily.        Review of Systems  Constitutional: Negative for fever, chills and weight loss.       Tearful  Eyes: Negative.   Respiratory: Negative.   Gastrointestinal: Positive for abdominal pain.  Genitourinary: Negative.   Musculoskeletal: Positive for back pain.  Skin: Negative for itching and rash.  Neurological: Positive for dizziness. Negative for tingling, tremors, speech change and headaches.  Psychiatric/Behavioral: Positive for depression. The patient is nervous/anxious.    Physical Exam   Blood pressure 118/78, pulse 94, temperature 98.4 F (36.9 C), temperature source Oral, resp. rate 16, height 5\' 5"  (1.651 m), weight 76.023 kg (167 lb 9.6 oz), SpO2 100.00%.  Physical Exam  Constitutional: She is oriented to person, place, and time. She appears well-developed and well-nourished. No distress.  Neck: No tracheal deviation present. No thyromegaly present.  Respiratory: Effort normal and breath sounds normal.  GI: Soft. Hernia confirmed negative in the right inguinal area and confirmed negative in the left inguinal area.  Genitourinary: Uterus normal. Cervix exhibits discharge ( Mirena IUD strings visable). Cervix exhibits  no motion tenderness and no friability. Right adnexum displays no mass, no tenderness and no fullness. Left adnexum displays no mass, no tenderness and no fullness. No erythema, tenderness or bleeding around the vagina. No foreign body around the vagina. No signs of injury around the vagina. No vaginal discharge found.  Lymphadenopathy:       Right: No inguinal adenopathy present.       Left: No inguinal adenopathy present.  Neurological: She is alert and oriented to person, place, and time. No cranial nerve deficit or sensory deficit.  Skin: Skin is warm, dry and intact. She is  not diaphoretic. No pallor.  Psychiatric: She has a normal mood and affect. Her behavior is normal. Judgment and thought content normal.    MAU Course  Procedures Results for orders placed during the hospital encounter of 01/08/13 (from the past 24 hour(s))  URINALYSIS, ROUTINE W REFLEX MICROSCOPIC     Status: None   Collection Time    01/08/13 11:50 AM      Result Value Range   Color, Urine YELLOW  YELLOW   APPearance CLEAR  CLEAR   Specific Gravity, Urine 1.020  1.005 - 1.030   pH 7.0  5.0 - 8.0   Glucose, UA NEGATIVE  NEGATIVE mg/dL   Hgb urine dipstick NEGATIVE  NEGATIVE   Bilirubin Urine NEGATIVE  NEGATIVE   Ketones, ur NEGATIVE  NEGATIVE mg/dL   Protein, ur NEGATIVE  NEGATIVE mg/dL   Urobilinogen, UA 0.2  0.0 - 1.0 mg/dL   Nitrite NEGATIVE  NEGATIVE   Leukocytes, UA NEGATIVE  NEGATIVE  POCT PREGNANCY, URINE     Status: None   Collection Time    01/08/13 12:10 PM      Result Value Range   Preg Test, Ur NEGATIVE  NEGATIVE  WET PREP, GENITAL     Status: Abnormal   Collection Time    01/08/13  3:00 PM      Result Value Range   Yeast Wet Prep HPF POC NONE SEEN  NONE SEEN   Trich, Wet Prep NONE SEEN  NONE SEEN   Clue Cells Wet Prep HPF POC NONE SEEN  NONE SEEN   WBC, Wet Prep HPF POC FEW (*) NONE SEEN      Assessment and Plan  Chronic back pain/being seen by Orhopedist. Vertigo likely related to anxiety/start Zoloft Vaginal discharge/negative wet prep, pending GC/Chlamydia    CLARK, MICHAEL L 01/08/2013, 3:40 PM

## 2013-01-08 NOTE — MAU Note (Signed)
Patient states she has had a Mirena for 3-4 years. Has been having left back pain that sometime radiates to the left side for about one month. Has a vaginal discharge with an odor that is getting worse for about 2 weeks. Has periods of nausea, no vomiting, that make her dizzy.

## 2013-01-09 LAB — GC/CHLAMYDIA PROBE AMP: GC Probe RNA: NEGATIVE

## 2013-01-11 NOTE — MAU Provider Note (Signed)
Attestation of Attending Supervision of Advanced Practitioner (CNM/NP): Evaluation and management procedures were performed by the Advanced Practitioner under my supervision and collaboration.  I have reviewed the Advanced Practitioner's note and chart, and I agree with the management and plan.  HARRAWAY-SMITH, Kary Sugrue 4:39 PM

## 2013-02-23 ENCOUNTER — Inpatient Hospital Stay (HOSPITAL_COMMUNITY): Payer: No Typology Code available for payment source

## 2013-02-23 ENCOUNTER — Inpatient Hospital Stay (HOSPITAL_COMMUNITY)
Admission: AD | Admit: 2013-02-23 | Discharge: 2013-02-23 | Disposition: A | Discharge status: Home / Self Care | Payer: No Typology Code available for payment source | Source: Ambulatory Visit | Attending: Obstetrics and Gynecology | Admitting: Obstetrics and Gynecology

## 2013-02-23 ENCOUNTER — Encounter (HOSPITAL_COMMUNITY): Payer: Self-pay | Admitting: *Deleted

## 2013-02-23 DIAGNOSIS — R109 Unspecified abdominal pain: Secondary | ICD-10-CM | POA: Insufficient documentation

## 2013-02-23 DIAGNOSIS — Z975 Presence of (intrauterine) contraceptive device: Secondary | ICD-10-CM

## 2013-02-23 DIAGNOSIS — Z30431 Encounter for routine checking of intrauterine contraceptive device: Secondary | ICD-10-CM | POA: Insufficient documentation

## 2013-02-23 HISTORY — DX: Herpesviral infection, unspecified: B00.9

## 2013-02-23 HISTORY — DX: Anxiety disorder, unspecified: F41.9

## 2013-02-23 HISTORY — DX: Chlamydial infection, unspecified: A74.9

## 2013-02-23 HISTORY — DX: Trichomoniasis, unspecified: A59.9

## 2013-02-23 LAB — URINALYSIS, ROUTINE W REFLEX MICROSCOPIC
Ketones, ur: NEGATIVE mg/dL
Leukocytes, UA: NEGATIVE
Nitrite: NEGATIVE
Protein, ur: NEGATIVE mg/dL
pH: 7 (ref 5.0–8.0)

## 2013-02-23 LAB — WET PREP, GENITAL: Trich, Wet Prep: NONE SEEN

## 2013-02-23 NOTE — MAU Provider Note (Signed)
Chief Complaint: No chief complaint on file.   First Provider Initiated Contact with Patient 02/23/13 1337     SUBJECTIVE HPI: [name] is a 27 y.o. G2P1011 who presents to maternity admissions reporting she was taking a shower and cleaning herself and pulled Mirena string by accident and felt a sharp pain on her left lower abdomen. She denies vaginal bleeding, vaginal itching/burning, urinary symptoms, h/a, dizziness, n/v, or fever/chills.     Past Medical History  Diagnosis Date  . No pertinent past medical history   . Anxiety   . Chlamydia   . Trichomonas   . Herpes    Past Surgical History  Procedure Laterality Date  . Cesarean section    . Wisdom tooth extraction     History   Social History  . Marital Status: Single    Spouse Name: N/A    Number of Children: N/A  . Years of Education: N/A   Occupational History  . Not on file.   Social History Main Topics  . Smoking status: Former Smoker -- 1.50 packs/day    Types: Cigarettes  . Smokeless tobacco: Never Used  . Alcohol Use: No  . Drug Use: No  . Sexual Activity: Yes    Birth Control/ Protection: IUD   Other Topics Concern  . Not on file   Social History Narrative  . No narrative on file   No current facility-administered medications on file prior to encounter.   No current outpatient prescriptions on file prior to encounter.   Allergies  Allergen Reactions  . Amoxicillin Other (See Comments)    Skin peeling  . Amoxapine And Related   . Clindamycin/Lincomycin Other (See Comments)    Skin peels on perineum, lower pubic area, and groin. Same reaction as  amoxicillin  . Metronidazole     Skin peels    ROS: Pertinent items in HPI  OBJECTIVE Blood pressure 116/62, pulse 76, temperature 98.5 F (36.9 C), temperature source Oral, resp. rate 16, height 5\' 7"  (1.702 m), weight 168 lb 9.6 oz (76.476 kg), SpO2 100.00%. GENERAL: Well-developed, well-nourished female in no acute distress.  HEENT:  Normocephalic HEART: normal rate RESP: normal effort ABDOMEN: Soft, non-tender EXTREMITIES: Nontender, no edema NEURO: Alert and oriented Pelvic exam: Cervix pink, visually closed, without lesion, scant white creamy discharge, Mirena strings visible, 1 string 4 cm from cervical os, no part of Mirena device visible at os, Mirena string cut shorter per pt request, vaginal walls and external genitalia normal Bimanual exam: Cervix 0/long/high, anterior, soft externally firmer closer to internal os (suspicious for Mirena lower uterine segment, into cervix), neg CMT, uterus nontender, nonenlarged, adnexa without tenderness, enlargement, or mass  LAB RESULTS Results for orders placed during the hospital encounter of 02/23/13 (from the past 168 hour(s))  URINALYSIS, ROUTINE W REFLEX MICROSCOPIC   Collection Time    02/23/13  1:10 PM      Result Value Range   Color, Urine YELLOW  YELLOW   APPearance CLEAR  CLEAR   Specific Gravity, Urine 1.020  1.005 - 1.030   pH 7.0  5.0 - 8.0   Glucose, UA NEGATIVE  NEGATIVE mg/dL   Hgb urine dipstick NEGATIVE  NEGATIVE   Bilirubin Urine NEGATIVE  NEGATIVE   Ketones, ur NEGATIVE  NEGATIVE mg/dL   Protein, ur NEGATIVE  NEGATIVE mg/dL   Urobilinogen, UA 0.2  0.0 - 1.0 mg/dL   Nitrite NEGATIVE  NEGATIVE   Leukocytes, UA NEGATIVE  NEGATIVE  POCT PREGNANCY, URINE  Collection Time    02/23/13  1:15 PM      Result Value Range   Preg Test, Ur NEGATIVE  NEGATIVE  WET PREP, GENITAL   Collection Time    02/23/13  1:40 PM      Result Value Range   Yeast Wet Prep HPF POC NONE SEEN  NONE SEEN   Trich, Wet Prep NONE SEEN  NONE SEEN   Clue Cells Wet Prep HPF POC FEW (*) NONE SEEN   WBC, Wet Prep HPF POC FEW (*) NONE SEEN  GC/CHLAMYDIA PROBE AMP   Collection Time    02/23/13  1:40 PM      Result Value Range   CT Probe RNA NEGATIVE  NEGATIVE   GC Probe RNA NEGATIVE  NEGATIVE     IMAGING US Transvaginal Non-ob  02/23/2013   CLINICAL DATA:  Check Mirena IUD  placement  EXAM: TRANSABDOMINAL AND TRANSVAGINAL ULTRASOUND OF PELVIS  TECHNIQUE: Both transabdominal and transvaginal ultrasound examinations of the pelvis were performed. Transabdominal technique was performed for global imaging of the pelvis including uterus, ovaries, adnexal regions, and pelvic cul-de-sac. It was necessary to proceed with endovaginal exam following the transabdominal exam to visualize the endometrium and right ovary.  COMPARISON:  10/04/2012  FINDINGS: Uterus  Measurements: 6.9 x 4.5 x 4.9 cm. No fibroids or other mass visualized.  Endometrium  Thickness: 2 mm.  IUD in satisfactory position.  Right ovary  Measurements: 3.4 x 2.0 x 3.8 cm. Normal appearance/no adnexal mass.  Left ovary  Measurements: 3.5 x 2.3 x 4.2 cm. 1.7 x 2.0 x 1.6 cm hemorrhagic cyst.  Other findings  No free fluid.  IMPRESSION: IUD in satisfactory position.   Electronically Signed   By: Charline Bills M.D.   On: 02/23/2013 14:42   US Pelvis Complete  02/23/2013   CLINICAL DATA:  Check Mirena IUD placement  EXAM: TRANSABDOMINAL AND TRANSVAGINAL ULTRASOUND OF PELVIS  TECHNIQUE: Both transabdominal and transvaginal ultrasound examinations of the pelvis were performed. Transabdominal technique was performed for global imaging of the pelvis including uterus, ovaries, adnexal regions, and pelvic cul-de-sac. It was necessary to proceed with endovaginal exam following the transabdominal exam to visualize the endometrium and right ovary.  COMPARISON:  10/04/2012  FINDINGS: Uterus  Measurements: 6.9 x 4.5 x 4.9 cm. No fibroids or other mass visualized.  Endometrium  Thickness: 2 mm.  IUD in satisfactory position.  Right ovary  Measurements: 3.4 x 2.0 x 3.8 cm. Normal appearance/no adnexal mass.  Left ovary  Measurements: 3.5 x 2.3 x 4.2 cm. 1.7 x 2.0 x 1.6 cm hemorrhagic cyst.  Other findings  No free fluid.  IMPRESSION: IUD in satisfactory position.   Electronically Signed   By: Charline Bills M.D.   On: 02/23/2013 14:42     ASSESSMENT 1. IUD (intrauterine device) in place     PLAN Discharge home F/U in Gyn clinic Return to MAU as needed    Medication List         naproxen 250 MG tablet  Commonly known as:  NAPROSYN  Take 250 mg by mouth as needed (headache).       Follow-up Information   Follow up with [facility]   Specialty:  Obstetrics and Gynecology   Contact information:   [address] [place] [address] [phone]      Sharen Counter Certified Nurse-Midwife 02/24/2013  4:49 PM

## 2013-02-23 NOTE — MAU Note (Signed)
Patient states she got her Mirena string caught on her finger and feels like it is coming out. Has had slight cramping today, no bleeding or discharge.

## 2013-02-24 LAB — GC/CHLAMYDIA PROBE AMP: GC Probe RNA: NEGATIVE

## 2013-02-25 NOTE — MAU Provider Note (Signed)
Attestation of Attending Supervision of Advanced Practitioner (CNM/NP): Evaluation and management procedures were performed by the Advanced Practitioner under my supervision and collaboration.  I have reviewed the Advanced Practitioner's note and chart, and I agree with the management and plan.  Merril Nagy 02/25/2013 8:09 AM

## 2013-03-25 ENCOUNTER — Encounter (HOSPITAL_COMMUNITY): Payer: Self-pay | Admitting: Emergency Medicine

## 2013-03-25 ENCOUNTER — Emergency Department (HOSPITAL_COMMUNITY)
Admission: EM | Admit: 2013-03-25 | Discharge: 2013-03-25 | Disposition: A | Discharge status: Home / Self Care | Payer: No Typology Code available for payment source | Attending: Emergency Medicine | Admitting: Emergency Medicine

## 2013-03-25 DIAGNOSIS — J020 Streptococcal pharyngitis: Secondary | ICD-10-CM | POA: Insufficient documentation

## 2013-03-25 DIAGNOSIS — Z79899 Other long term (current) drug therapy: Secondary | ICD-10-CM | POA: Insufficient documentation

## 2013-03-25 DIAGNOSIS — F411 Generalized anxiety disorder: Secondary | ICD-10-CM | POA: Insufficient documentation

## 2013-03-25 DIAGNOSIS — R599 Enlarged lymph nodes, unspecified: Secondary | ICD-10-CM | POA: Insufficient documentation

## 2013-03-25 DIAGNOSIS — Z87891 Personal history of nicotine dependence: Secondary | ICD-10-CM | POA: Insufficient documentation

## 2013-03-25 MED ORDER — PENICILLIN G BENZATHINE 1200000 UNIT/2ML IM SUSP
1.2000 10*6.[IU] | Freq: Once | INTRAMUSCULAR | Status: AC
  Administered 2013-03-25: 1.2 10*6.[IU] via INTRAMUSCULAR
  Filled 2013-03-25: qty 2

## 2013-03-25 MED ORDER — HYDROCODONE-ACETAMINOPHEN 5-325 MG PO TABS: 2.0000 | ORAL_TABLET | ORAL | Status: DC | PRN

## 2013-03-25 NOTE — ED Provider Notes (Signed)
CSN: [id number]     Arrival date & time 03/25/13  0102 History   First MD Initiated Contact with Patient 03/25/13 1005     Chief Complaint  Patient presents with  . Sore Throat   (Consider location/radiation/quality/duration/timing/severity/associated sxs/prior Treatment) HPI  27 year old female with history of anxiety presents complaining of sore throat. Onset the past 3 days, described as tenderness with swallowing or with touching her neck.  Symptom has been gradual and progressive. She tries taking NyQuil with minimal relief. She denies fever, chills, headache, nose, sneezing, coughing, ear pain, rash, chest pain or trouble breathing. She complained of some occasional sharp pain to her chest usually improves when she tapped her chest.  No active chest pain.  Denies any nausea vomiting diarrhea or abdominal pain. No recent sick contact or recent travel.  Past Medical History  Diagnosis Date  . No pertinent past medical history   . Anxiety   . Chlamydia   . Trichomonas   . Herpes    Past Surgical History  Procedure Laterality Date  . Cesarean section    . Wisdom tooth extraction     Family History  Problem Relation Age of Onset  . Adopted: Yes  . Other Neg Hx    History  Substance Use Topics  . Smoking status: Former Smoker -- 1.50 packs/day    Types: Cigarettes  . Smokeless tobacco: Never Used  . Alcohol Use: No   OB History   Grav Para Term Preterm Abortions TAB SAB Ect Mult Living   2 1 1  1  1   1      Review of Systems  Constitutional: Negative for fever.  HENT: Positive for sore throat. Negative for drooling and ear pain.   Skin: Negative for rash.  Neurological: Negative for headaches.    Allergies  Amoxicillin; Amoxapine and related; Clindamycin/lincomycin; and Metronidazole  Home Medications   Current Outpatient Rx  Name  Route  Sig  Dispense  Refill  . DM-Doxylamine-Acetaminophen (NYQUIL COLD & FLU PO)   Oral   Take 30 mLs by mouth at bedtime.          . sertraline (ZOLOFT) 50 MG tablet   Oral   Take 50 mg by mouth daily.          BP 121/71  Pulse 77  Temp(Src) 98.9 F (37.2 C) (Oral)  Resp 18  Ht 5\' 5"  (1.651 m)  Wt 175 lb (79.379 kg)  BMI 29.12 kg/m2  SpO2 100% Physical Exam  Nursing note and vitals reviewed. Constitutional: She appears well-developed and well-nourished. No distress.  HENT:  Head: Atraumatic.  Right Ear: External ear normal.  Left Ear: External ear normal.  Mouth/Throat: Oropharynx is clear and moist.  Throat: Uvula is midline, mild posterior oropharyngeal erythema and now left tonsillar enlargement without exudate. No evidence of deep tissue infection, no trismus.  Eyes: Conjunctivae are normal.  Neck: Neck supple.  Abdominal: There is no tenderness.  Lymphadenopathy:    She has cervical adenopathy (Mild anterior cervical lymphadenopathy.).  Neurological: She is alert.  Skin: No rash noted.  Psychiatric: She has a normal mood and affect.    ED Course  Procedures (including critical care time)  Complain of sore throat. No concerning finding on exam. Normal voice, no evidence of deep tissue infection. She is afebrile with stable normal vital sign.  10:42 AM Strep test positive.  Pt sts her allergy to amox is yeast infection.  No true drug allergy.  Will treat with  Pen G 1.2 mil unit IM.  Pt sts son is also sick, recommend son to be seen by pediatrician.    Labs Review Labs Reviewed  RAPID STREP SCREEN - Abnormal; Notable for the following:    Streptococcus, Group A Screen (Direct) POSITIVE (*)    All other components within normal limits   Imaging Review No results found.  EKG Interpretation   None       MDM   1. Strep pharyngitis    BP 121/71  Pulse 77  Temp(Src) 98.9 F (37.2 C) (Oral)  Resp 18  Ht 5\' 5"  (1.651 m)  Wt 175 lb (79.379 kg)  BMI 29.12 kg/m2  SpO2 100%  I have reviewed nursing notes and vital signs.  I reviewed available ER/hospitalization records  thought the EMR     Fayrene Helper, [place] 03/25/13 1045

## 2013-03-25 NOTE — ED Notes (Signed)
Pt states she's had a sore throat x 3 days, states hurts to swallow or touch neck, states also for "a while" shes had sharp pains in chest that go away after "tapping" chest.

## 2013-03-25 NOTE — Progress Notes (Signed)
[facility] provided pt with a list of primary care resources and a [facility] [employer] application.  °

## 2013-03-26 NOTE — ED Provider Notes (Signed)
Medical screening examination/treatment/procedure(s) were performed by non-physician practitioner and as supervising physician I was immediately available for consultation/collaboration.   Jaquisha Frech T Shandricka Monroy, MD 03/26/13 1552 

## 2013-04-01 ENCOUNTER — Other Ambulatory Visit: Payer: Self-pay

## 2013-04-02 ENCOUNTER — Ambulatory Visit: Payer: No Typology Code available for payment source | Admitting: Medical

## 2013-05-27 DIAGNOSIS — I8001 Phlebitis and thrombophlebitis of superficial vessels of right lower extremity: Secondary | ICD-10-CM

## 2013-05-27 HISTORY — DX: Phlebitis and thrombophlebitis of superficial vessels of right lower extremity: I80.01

## 2013-06-22 ENCOUNTER — Emergency Department (HOSPITAL_COMMUNITY)
Admission: EM | Admit: 2013-06-22 | Discharge: 2013-06-22 | Disposition: A | Discharge status: Home / Self Care | Payer: BC Managed Care – PPO | Attending: Emergency Medicine | Admitting: Emergency Medicine

## 2013-06-22 ENCOUNTER — Emergency Department (HOSPITAL_COMMUNITY): Payer: BC Managed Care – PPO

## 2013-06-22 ENCOUNTER — Encounter (HOSPITAL_COMMUNITY): Payer: Self-pay | Admitting: Emergency Medicine

## 2013-06-22 DIAGNOSIS — R109 Unspecified abdominal pain: Secondary | ICD-10-CM

## 2013-06-22 DIAGNOSIS — R1011 Right upper quadrant pain: Secondary | ICD-10-CM | POA: Insufficient documentation

## 2013-06-22 DIAGNOSIS — R1012 Left upper quadrant pain: Secondary | ICD-10-CM | POA: Insufficient documentation

## 2013-06-22 DIAGNOSIS — Z3202 Encounter for pregnancy test, result negative: Secondary | ICD-10-CM | POA: Insufficient documentation

## 2013-06-22 DIAGNOSIS — R112 Nausea with vomiting, unspecified: Secondary | ICD-10-CM | POA: Insufficient documentation

## 2013-06-22 DIAGNOSIS — R197 Diarrhea, unspecified: Secondary | ICD-10-CM | POA: Insufficient documentation

## 2013-06-22 DIAGNOSIS — R1013 Epigastric pain: Secondary | ICD-10-CM | POA: Insufficient documentation

## 2013-06-22 DIAGNOSIS — Z87891 Personal history of nicotine dependence: Secondary | ICD-10-CM | POA: Insufficient documentation

## 2013-06-22 DIAGNOSIS — Z8619 Personal history of other infectious and parasitic diseases: Secondary | ICD-10-CM | POA: Insufficient documentation

## 2013-06-22 DIAGNOSIS — Z8659 Personal history of other mental and behavioral disorders: Secondary | ICD-10-CM | POA: Insufficient documentation

## 2013-06-22 DIAGNOSIS — R195 Other fecal abnormalities: Secondary | ICD-10-CM

## 2013-06-22 LAB — URINALYSIS, ROUTINE W REFLEX MICROSCOPIC
Bilirubin Urine: NEGATIVE
Glucose, UA: NEGATIVE mg/dL
Hgb urine dipstick: NEGATIVE
Ketones, ur: NEGATIVE mg/dL
NITRITE: NEGATIVE
Protein, ur: NEGATIVE mg/dL
Specific Gravity, Urine: 1.021 (ref 1.005–1.030)
UROBILINOGEN UA: 1 mg/dL (ref 0.0–1.0)
pH: 6.5 (ref 5.0–8.0)

## 2013-06-22 LAB — BASIC METABOLIC PANEL
BUN: 8 mg/dL (ref 6–23)
CO2: 25 mEq/L (ref 19–32)
Calcium: 9.1 mg/dL (ref 8.4–10.5)
Chloride: 105 mEq/L (ref 96–112)
Creatinine, Ser: 0.87 mg/dL (ref 0.50–1.10)
GFR calc non Af Amer: >90 mL/min (ref >90)
GLUCOSE: 92 mg/dL (ref 70–99)
Potassium: 4.4 mEq/L (ref 3.7–5.3)
SODIUM: 140 meq/L (ref 137–147)

## 2013-06-22 LAB — URINE MICROSCOPIC-ADD ON

## 2013-06-22 LAB — CBC
HCT: 35.7 % — ABNORMAL LOW (ref 36.0–46.0)
Hemoglobin: 12.3 g/dL (ref 12.0–15.0)
MCH: 32.1 pg (ref 26.0–34.0)
MCHC: 34.5 g/dL (ref 30.0–36.0)
MCV: 93.2 fL (ref 78.0–100.0)
Platelets: 233 10*3/uL (ref 150–400)
RBC: 3.83 MIL/uL — ABNORMAL LOW (ref 3.87–5.11)
RDW: 12.4 % (ref 11.5–15.5)
WBC: 6.9 10*3/uL (ref 4.0–10.5)

## 2013-06-22 LAB — POCT PREGNANCY, URINE: Preg Test, Ur: NEGATIVE

## 2013-06-22 LAB — HEPATIC FUNCTION PANEL
ALBUMIN: 3.6 g/dL (ref 3.5–5.2)
ALK PHOS: 48 U/L (ref 39–117)
ALT: 12 U/L (ref 0–35)
AST: 14 U/L (ref 0–37)
Bilirubin, Direct: <0.2 mg/dL (ref 0.0–0.3)
TOTAL PROTEIN: 6.8 g/dL (ref 6.0–8.3)
Total Bilirubin: <0.2 mg/dL — ABNORMAL LOW (ref 0.3–1.2)

## 2013-06-22 LAB — LIPASE, BLOOD: LIPASE: 21 U/L (ref 11–59)

## 2013-06-22 MED ORDER — ONDANSETRON HCL 4 MG/2ML IJ SOLN
4.0000 mg | Freq: Once | INTRAMUSCULAR | Status: AC
  Administered 2013-06-22: 4 mg via INTRAVENOUS
  Filled 2013-06-22: qty 2

## 2013-06-22 MED ORDER — ONDANSETRON HCL 4 MG PO TABS: 4.0000 mg | ORAL_TABLET | Freq: Three times a day (TID) | ORAL | Status: DC | PRN

## 2013-06-22 MED ORDER — SODIUM CHLORIDE 0.9 % IV BOLUS (SEPSIS)
1000.0000 mL | Freq: Once | INTRAVENOUS | Status: AC
  Administered 2013-06-22: 1000 mL via INTRAVENOUS

## 2013-06-22 NOTE — Discharge Instructions (Signed)
Abdominal (belly) pain can be caused by many things. Your caregiver performed an examination and possibly ordered blood/urine tests and imaging (CT scan, x-rays, ultrasound). Many cases can be observed and treated at home after initial evaluation in the emergency department. Even though you are being discharged home, abdominal pain can be unpredictable. Therefore, you need a repeated exam if your pain does not resolve, returns, or worsens. Most patients with abdominal pain don't have to be admitted to the hospital or have surgery, but serious problems like appendicitis and gallbladder attacks can start out as nonspecific pain. Many abdominal conditions cannot be diagnosed in one visit, so follow-up evaluations are very important. SEEK IMMEDIATE MEDICAL ATTENTION IF: The pain does not go away or becomes severe.  A temperature above 101 develops.  Repeated vomiting occurs (multiple episodes).  The pain becomes localized to portions of the abdomen. The right side could possibly be appendicitis. In an adult, the left lower portion of the abdomen could be colitis or diverticulitis.  Blood is being passed in stools or vomit (bright red or black tarry stools).  Return also if you develop chest pain, difficulty breathing, dizziness or fainting, or become confused, poorly responsive, or inconsolable (young children).  Nausea and Vomiting Nausea is a sick feeling that often comes before throwing up (vomiting). Vomiting is a reflex where stomach contents come out of your mouth. Vomiting can cause severe loss of body fluids (dehydration). Children and elderly adults can become dehydrated quickly, especially if they also have diarrhea. Nausea and vomiting are symptoms of a condition or disease. It is important to find the cause of your symptoms. CAUSES   Direct irritation of the stomach lining. This irritation can result from increased acid production (gastroesophageal reflux disease), infection, food poisoning,  taking certain medicines (such as nonsteroidal anti-inflammatory drugs), alcohol use, or tobacco use.  Signals from the brain.These signals could be caused by a headache, heat exposure, an inner ear disturbance, increased pressure in the brain from injury, infection, a tumor, or a concussion, pain, emotional stimulus, or metabolic problems.  An obstruction in the gastrointestinal tract (bowel obstruction).  Illnesses such as diabetes, hepatitis, gallbladder problems, appendicitis, kidney problems, cancer, sepsis, atypical symptoms of a heart attack, or eating disorders.  Medical treatments such as chemotherapy and radiation.  Receiving medicine that makes you sleep (general anesthetic) during surgery. DIAGNOSIS Your caregiver may ask for tests to be done if the problems do not improve after a few days. Tests may also be done if symptoms are severe or if the reason for the nausea and vomiting is not clear. Tests may include:  Urine tests.  Blood tests.  Stool tests.  Cultures (to look for evidence of infection).  X-rays or other imaging studies. Test results can help your caregiver make decisions about treatment or the need for additional tests. TREATMENT You need to stay well hydrated. Drink frequently but in small amounts.You may wish to drink water, sports drinks, clear broth, or eat frozen ice pops or gelatin dessert to help stay hydrated.When you eat, eating slowly may help prevent nausea.There are also some antinausea medicines that may help prevent nausea. HOME CARE INSTRUCTIONS   Take all medicine as directed by your caregiver.  If you do not have an appetite, do not force yourself to eat. However, you must continue to drink fluids.  If you have an appetite, eat a normal diet unless your caregiver tells you differently.  Eat a variety of complex carbohydrates (rice, wheat, potatoes, bread), lean  meats, yogurt, fruits, and vegetables.  Avoid high-fat foods because they  are more difficult to digest.  Drink enough water and fluids to keep your urine clear or pale yellow.  If you are dehydrated, ask your caregiver for specific rehydration instructions. Signs of dehydration may include:  Severe thirst.  Dry lips and mouth.  Dizziness.  Dark urine.  Decreasing urine frequency and amount.  Confusion.  Rapid breathing or pulse. SEEK IMMEDIATE MEDICAL CARE IF:   You have blood or brown flecks (like coffee grounds) in your vomit.  You have black or bloody stools.  You have a severe headache or stiff neck.  You are confused.  You have severe abdominal pain.  You have chest pain or trouble breathing.  You do not urinate at least once every 8 hours.  You develop cold or clammy skin.  You continue to vomit for longer than 24 to 48 hours.  You have a fever. MAKE SURE YOU:   Understand these instructions.  Will watch your condition.  Will get help right away if you are not doing well or get worse. Document Released: 05/13/2005 Document Revised: 08/05/2011 Document Reviewed: 10/10/2010 [facility] Patient Information 2014 [place], [place].    Emergency Department Resource Guide 1) Find a Doctor and Pay Out of Pocket Although you won't have to find out who is covered by your insurance plan, it is a good idea to ask around and get recommendations. You will then need to call the office and see if the doctor you have chosen will accept you as a new patient and what types of options they offer for patients who are self-pay. Some doctors offer discounts or will set up payment plans for their patients who do not have insurance, but you will need to ask so you aren't surprised when you get to your appointment.  2) Contact Your Local Health Department Not all health departments have doctors that can see patients for sick visits, but many do, so it is worth a call to see if yours does. If you don't know where your local health department is, you can  check in your phone book. The CDC also has a tool to help you locate your state's health department, and many state websites also have listings of all of their local health departments.  3) Find a Walk-in Clinic If your illness is not likely to be very severe or complicated, you may want to try a walk in clinic. These are popping up all over the country in pharmacies, drugstores, and shopping centers. They're usually staffed by nurse practitioners or physician assistants that have been trained to treat common illnesses and complaints. They're usually fairly quick and inexpensive. However, if you have serious medical issues or chronic medical problems, these are probably not your best option.  No Primary Care Doctor: - Call Health Connect at  [phone] - they can help you locate a primary care doctor that  accepts your insurance, provides certain services, etc. - Physician Referral Service- [phone]  Chronic Pain Problems: Organization         Address  Phone   Notes  [facility]  [phone] Patients need to be referred by their primary care doctor.   Medication Assistance: Organization         Address  Phone   Notes  [facility] [address] [place], [place] [address] [phone] --Must be a resident of [facility] -- Must have NO  insurance coverage whatsoever (no Medicaid/ Medicare, etc.) -- The pt. MUST have a primary care doctor that directs their care regularly and follows them in the community   [facility]  [phone]) [phone]   [employer]  [phone]    Agencies that provide inexpensive medical care: Organization         Address  Phone   Notes  [facility]  [phone]) [phone]   [facility]    [phone]   [facility] [address] [place], [place] [address] [phone]) [phone]   [facility] [place] [address], [place] [phone]) [phone]    Planned Parenthood    [phone]) [phone]   [facility]    [phone]   [facility]  [address], [place] Phone:  [phone]) [phone], Fax:  [phone]) [phone] Hours of Operation:  9 am - 6 pm, M-F.  Also accepts Medicaid/Medicare and self-pay.  [facility]  [address], [place] Phone: [phone], Fax: [phone]. Hours of Operation:  8:30 am - 5:30 pm, M-F.  Also accepts Medicaid and self-pay.  [facility] [place] [address], [place] Phone: [phone]) [phone]   [facility] [address], [place], [place] [phone], Ext. [phone] Mondays & Thursdays: 7-9 AM.  First 15 patients are seen on a first come, first serve basis.    Medicaid-accepting [facility] Providers:  Organization         Address  Phone   Notes  [facility] [address], [place] [phone] Also accepts self-pay patients.  [facility] [address], [place]  [phone]) [phone]   [facility] [address], [place] [phone]) [phone]   [facility] [address] [place] [address], [place] [phone]) [phone]   Renaye RakersVeita Bland [address], [place]   [phone] Only accepts [place] Access [place] patients after they have their name applied to their card.   Self-Pay (no insurance) in [facility]:  Organization         Address  Phone   Notes  Sickle Cell Patients, [facility] [address], [place] [phone]) [phone]   [facility] [address], [place] [phone]) [phone]   [facility] [place]  [address], [place] [phone]   [facility]/Dr. Osei-Bonsu  [address] [place] [address], [place] or [phone], [place] [phone]) [phone] Phone number for both [place] and [place] locations is the  same.  [facility] [address], [place] [phone]) [phone]   [facility] [place] [address] [place] [address], [place] or [address] [phone]) [phone]   [facility] [address], [place] [phone]) [phone], phone; [phone]) [phone], fax Sees patients 1st and 3rd Saturday of every month.  Must not qualify for public or private insurance (i.e. Medicaid, Medicare, [place] Health Choice, Veterans' Benefits)  Household income should be no more than 200% of the poverty level The clinic cannot treat you if you are pregnant or think you are pregnant  Sexually transmitted diseases are not treated at the clinic.    Dental Care: Organization         Address  Phone  Notes  [facility]  Department of Public Health [facility] [address] [place], [place] [phone] Accepts children up to age 51 who are enrolled in [place] or [place] Health Choice; pregnant women with a Medicaid card; and children who have applied for Medicaid or [place] Health Choice, but were declined, whose parents can pay a reduced fee at time of service.  [facility]  [address], [place] [phone] Accepts children up to age 40 who are enrolled in [place] or [place] Health Choice; pregnant women with a Medicaid card; and children who have applied for Medicaid or [place] Health Choice, but were declined, whose parents can pay a reduced fee at time of service.  [facility]  [address] [place], [place] [phone] Patients are seen by appointment only. Walk-ins are not accepted. [facility] will see patients 48 years of age and older. Monday - Tuesday (8am-5pm) Most Wednesdays (8:30-5pm) $30 per visit, cash only  [facility]  [address], [facility] [phone] Patients are seen by appointment only. Walk-ins are not accepted. [facility] will see patients  71 years of age and older. One Wednesday Evening (Monthly: Volunteer Based).  $30 per visit, cash only  [employer]  [phone] for adults; Children under age 22, call Graduate Pediatric Dentistry at [phone]. Children aged 26-14, please call [phone] to request a pediatric application.  Dental services are provided in all areas of dental care including fillings, crowns and bridges, complete and partial dentures, implants, gum treatment, root canals, and extractions. Preventive care is also provided. Treatment is provided to both adults and children. Patients are selected via a lottery and there is often a waiting list.   [facility] [address], [place]  [phone] [email or web address]   [facility] [address] [place], [place] [phone], Ext. [phone] Second and Fourth Thursday of each month, opens at 6:30 AM; Clinic ends at 9 AM.  Patients are seen on a first-come first-served basis, and a limited number are seen during each clinic.   [facility]  [address] [place], [place] [phone]   Eligibility Requirements You must have lived in [place], [place], or [place] counties for at least the last three months.   You cannot be eligible for state or federal sponsored [place], including [employer], [place], or [employer].   You generally cannot be eligible for healthcare insurance through your employer.    How to apply: Eligibility screenings are held every Tuesday and Wednesday afternoon from 1:00 pm until 4:00 pm. You do not need an appointment for the interview!  [facility] [address] [place], [place] [phone]   [facility] Health Department  [phone]   [facility] Health Department  [phone]   [facility] Health Department  [phone]    Behavioral Health Resources in the Community: Intensive Outpatient  Programs Organization         Address  Phone  Notes  [facility] Services [address] [place], [place] [phone]   [facility] Outpatient [address] [place], [place] [phone]   [facility]: [facility] [address] [place], [place]  [phone]   [facility] [address],  [place], [place] [phone] or [phone]   Substance Abuse Resources Organization  Address  Phone  Notes  [facility]  [phone]   [facility]  [phone]   [facility]  [phone]   [facility]  [phone]   Residential & Outpatient Substance Abuse Program  [phone]   Psychological Services Organization         Address  Phone  Notes  [facility]  [phone]- [phone]   [facility]  [phone]- [phone]   [facility] [address], [place] [phone] or [phone]    Mobile Crisis Teams Organization         Address  Phone  Notes  [facility], Mobile Crisis Care Unit  [phone]   [facility]  [address] [place], [place] [phone]   Doristine LocksSharon DeEsch [address] [place] [phone]    Self-Help/Support Groups Organization         Address  Phone             Notes  [facility] [place] - variety of support groups  336- [phone] Call for more information  Narcotics Anonymous (NA), [facility] [address], [place]  2 meetings at this location   Statisticianesidential Treatment Programs Organization         Address  Phone  Notes  [facility] [address],    [place]  [phone]   [facility]  [address], [place] [phone], [place], [place] [phone]   [facility] [address], [place] [phone] Admissions: 8am-3pm M-F  [facility] [address]    [place], [place]  [phone]   [facility] [address], [place], [place] [phone]   [facility] [address]  [place], [place] [phone]   [facility] - Intensive Outpatient [address], [place], [place] [phone]   [facility] ([facility] [address]  [place], [place] [phone] or [phone]   [facility] ([facility]) [address] [place], [place] [phone] Accepts Medicaid  [facility] [address]  [place] [phone] Substance Abuse/Addiction Treatment   [facility] Behavioral Health Resources Organization         Address  Phone  Notes  [facility]  [phone]) [phone]   Angie FavaJulie Brannon, PhD [address], [address] [place], [place]   [phone](336) [phone] or [phone]) [phone]   [facility]   [address] [place], [place] [phone](336) [phone]   [facility] [address], [place], [place] [phone]) [phone] Insurance/Medicaid/sponsorship through [facility]  [facility] Families [address]                                    [place], [place] [phone]) [phone] Therapy/tele-psych/case  [facility] [address]   [place], [place] [phone]) [phone]    Dr. Lolly MustacheArfeen  [phone](336) [phone]   [facility] [place]  [employer] [facility] 1) [address], [place] 2) [address], [place] 3)  [address], [place] [phone](336) [phone]) [phone]  [phone](336) [phone]   [facility] Child Abuse Hotline [phone](336) [phone] or [phone](336) [phone] (After Hours)

## 2013-06-22 NOTE — ED Notes (Signed)
Pt states she is currently unable to urinate.

## 2013-06-22 NOTE — ED Notes (Signed)
Patient ambulatory from ED WR without difficulty No active vomiting--patient in NAD

## 2013-06-22 NOTE — ED Notes (Signed)
Waiting on discharge paperwork.

## 2013-06-22 NOTE — ED Notes (Signed)
Ultrasound at bedside

## 2013-06-22 NOTE — ED Notes (Signed)
Patient states she started to have nausea since Friday. Patient states she started to vomit about once a day. Denies fever.

## 2013-06-22 NOTE — ED Provider Notes (Signed)
CSN: [id number]     Arrival date & time 06/22/13  [id number] History   First MD Initiated Contact with Patient 06/22/13 0617     Chief Complaint  Patient presents with  . Emesis   (Consider location/radiation/quality/duration/timing/severity/associated sxs/prior Treatment) HPI  This is a 28 year old female who presents the emergency department chief complaint of abdominal pain, nausea and vomiting.  Patient states onset 4 days ago.  She was exposed to a child who had vomiting for one day and thought she might have picked up a virus.  Patient states that she has constant nausea all day.  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  She complains of epigastric right upper quadrant and some left upper quadrant pain.  It is constant, nonradiating, nonpleuritic.  Patient has also had some loose stools and watery diarrhea.  Patient denies melena or hematochezia. She denies any fevers.  She's never had anything like this before.  Patient denies alcohol abuse.  No history of abdominal surgeries except for prior cesarean section.  Patient denies any urinary or vaginal symptoms.  Patient states that she is not currently using birth control and could possibly be pregnant.  She is unsure of the date of her last menstrual period as her periods have been irregular since removal of her Mirena IUD. Denies fevers, chills, myalgias, arthralgias. Denies DOE, SOB, chest tightness or pressure, radiation to left arm, jaw or back, or diaphoresis. Denies dysuria, flank pain, suprapubic pain, frequency, urgency, or hematuria. Denies headaches, light headedness, weakness, visual disturbances.   Past Medical History  Diagnosis Date  . No pertinent past medical history   . Anxiety   . Chlamydia   . Trichomonas   . Herpes    Past Surgical History  Procedure Laterality Date  . Cesarean section     . Wisdom tooth extraction     Family History  Problem Relation Age of Onset  . Adopted: Yes  . Other Neg Hx    History  Substance Use Topics  . Smoking status: Former Smoker -- 1.50 packs/day    Types: Cigarettes  . Smokeless tobacco: Never Used  . Alcohol Use: No   OB History   Grav Para Term Preterm Abortions TAB SAB Ect Mult Living   2 1 1  1  1   1      Review of Systems Ten systems reviewed and are negative for acute change, except as noted in the HPI.   Allergies  Amoxicillin; Amoxapine and related; Clindamycin/lincomycin; and Metronidazole  Home Medications  No current outpatient prescriptions on file. BP 117/70  Pulse 78  Temp(Src) 99 F (37.2 C) (Oral)  Resp 14  SpO2 97%  LMP 05/09/2013 Physical Exam Physical Exam  Nursing note and vitals reviewed. Constitutional: She is oriented to person, place, and time.  HENT:  Head: Normocephalic and atraumatic.  Eyes: Conjunctivae normal and EOM are normal. Pupils are equal, round, and reactive to light. No scleral icterus.  Neck: Normal range of motion.  Cardiovascular: Normal rate, regular rhythm and normal heart sounds.  Exam reveals no gallop and no friction rub.   No murmur heard. Pulmonary/Chest: Effort normal and breath sounds normal. No respiratory distress.  Abdominal: Soft. Bowel sounds are normal. She exhibits no distension and no mass.  Mild epigastric and right upper quadrant abdominal pain.There is no guarding.  Neurological: She is alert and oriented to person, place, and time.  Skin: Skin is warm and dry. She is not diaphoretic.    ED Course  Procedures (including critical care time) Labs Review Labs Reviewed  URINALYSIS, ROUTINE W REFLEX MICROSCOPIC  CBC  BASIC METABOLIC PANEL  LIPASE, BLOOD   Imaging Review No results found.  EKG Interpretation    Date/Time:  Tuesday June 22 2013 05:35:04 EST Ventricular Rate:  84 PR Interval:  168 QRS Duration: 81 QT Interval:  337 QTC  Calculation: 398 R Axis:   61 Text Interpretation:  Sinus rhythm Borderline T wave abnormalities Baseline wander in lead(s) V1 V2 V4 V6 No prior EKG Confirmed by Gwendolyn Grant  MD, BLAIR ([id number]) on 06/22/2013 5:57:35 AM            MDM  No diagnosis found. 6:53 AM Filed Vitals:   06/22/13 0542  BP: 117/70  Pulse: 78  Temp: 99 F (37.2 C)  Resp: 14   Patient with abdominal pain, nausea and vomiting.  Labs, urinalysis, pregnancy currently pending.  EKG shows shows some flattening of the T waves in leads 3, V5, V6.  Do not suspect acute coronary syndrome as the etiology of her vomiting today.  11:14 AM Filed Vitals:   06/22/13 0542  BP: 117/70  Pulse: 78  Temp: 99 F (37.2 C)  TempSrc: Oral  Resp: 14  SpO2: 97%  patient [place] negative for acute abnormality.  No repeat vomiting. Patient is asking to be discharged.  Will d/c with zofran and PCP referral. Patient is nontoxic, nonseptic appearing, in no apparent distress.  Patient's pain and other symptoms adequately managed in emergency department.  Fluid bolus given.  Labs, imaging and vitals reviewed.  Patient does not meet the SIRS or Sepsis criteria.  On repeat exam patient does not have a surgical abdomin and there are nor peritoneal signs.  No indication of appendicitis, bowel obstruction, bowel perforation, cholecystitis, diverticulitis.  Patient discharged home with symptomatic treatment and given strict instructions for follow-up with their primary care physician.  I have also discussed reasons to return immediately to the ER.  Patient expresses understanding and agrees with plan.     Arthor Captain, PA-C 06/22/13 1903

## 2013-06-23 NOTE — ED Provider Notes (Signed)
Medical screening examination/treatment/procedure(s) were performed by non-physician practitioner and as supervising physician I was immediately available for consultation/collaboration.  EKG Interpretation    Date/Time:  Tuesday June 22 2013 05:35:04 EST Ventricular Rate:  84 PR Interval:  168 QRS Duration: 81 QT Interval:  337 QTC Calculation: 398 R Axis:   61 Text Interpretation:  Sinus rhythm Borderline T wave abnormalities Baseline wander in lead(s) V1 V2 V4 V6 No prior EKG Confirmed by Gwendolyn GrantWALDEN  MD, Akoni Parton ([id number]) on 06/22/2013 5:57:35 AM              Dagmar HaitWilliam Lille Karim, MD 06/23/13 [phone]

## 2013-08-18 ENCOUNTER — Encounter (HOSPITAL_COMMUNITY): Payer: Self-pay | Admitting: Emergency Medicine

## 2013-08-18 ENCOUNTER — Emergency Department (HOSPITAL_COMMUNITY): Payer: BC Managed Care – PPO

## 2013-08-18 ENCOUNTER — Emergency Department (HOSPITAL_COMMUNITY)
Admission: EM | Admit: 2013-08-18 | Discharge: 2013-08-18 | Disposition: A | Discharge status: Home / Self Care | Payer: BC Managed Care – PPO | Attending: Emergency Medicine | Admitting: Emergency Medicine

## 2013-08-18 DIAGNOSIS — Z8619 Personal history of other infectious and parasitic diseases: Secondary | ICD-10-CM | POA: Insufficient documentation

## 2013-08-18 DIAGNOSIS — Z8659 Personal history of other mental and behavioral disorders: Secondary | ICD-10-CM | POA: Insufficient documentation

## 2013-08-18 DIAGNOSIS — Z87891 Personal history of nicotine dependence: Secondary | ICD-10-CM | POA: Insufficient documentation

## 2013-08-18 DIAGNOSIS — J40 Bronchitis, not specified as acute or chronic: Secondary | ICD-10-CM | POA: Insufficient documentation

## 2013-08-18 DIAGNOSIS — G479 Sleep disorder, unspecified: Secondary | ICD-10-CM | POA: Insufficient documentation

## 2013-08-18 MED ORDER — FLUTICASONE PROPIONATE 50 MCG/ACT NA SUSP: 2.0000 | Freq: Every day | NASAL | Status: DC

## 2013-08-18 MED ORDER — ALBUTEROL SULFATE HFA 108 (90 BASE) MCG/ACT IN AERS
2.0000 | INHALATION_SPRAY | RESPIRATORY_TRACT | Status: DC | PRN
  Administered 2013-08-18: 2 via RESPIRATORY_TRACT
  Filled 2013-08-18: qty 6.7

## 2013-08-18 MED ORDER — GUAIFENESIN ER 600 MG PO TB12: 600.0000 mg | ORAL_TABLET | Freq: Two times a day (BID) | ORAL | Status: DC

## 2013-08-18 MED ORDER — CETIRIZINE-PSEUDOEPHEDRINE ER 5-120 MG PO TB12: 1.0000 | ORAL_TABLET | Freq: Two times a day (BID) | ORAL | Status: DC

## 2013-08-18 MED ORDER — PREDNISONE 20 MG PO TABS
60.0000 mg | ORAL_TABLET | Freq: Once | ORAL | Status: AC
  Administered 2013-08-18: 60 mg via ORAL
  Filled 2013-08-18: qty 3

## 2013-08-18 MED ORDER — PREDNISONE 10 MG PO TABS: 20.0000 mg | ORAL_TABLET | Freq: Every day | ORAL | Status: DC

## 2013-08-18 NOTE — Discharge Instructions (Signed)
You were seen and evaluated for your continued cough and congestion symptoms. Your x-ray does not show any concerning signs of infection today. Please use the medications prescribed to help with your symptoms and followup with a primary care provider for continued evaluation and treatment.    Bronchitis Bronchitis is inflammation of the airways that extend from the windpipe into the lungs (bronchi). The inflammation often causes mucus to develop, which leads to a cough. If the inflammation becomes severe, it may cause shortness of breath. CAUSES  Bronchitis may be caused by:   Viral infections.   Bacteria.   Cigarette smoke.   Allergens, pollutants, and other irritants.  SIGNS AND SYMPTOMS  The most common symptom of bronchitis is a frequent cough that produces mucus. Other symptoms include:  Fever.   Body aches.   Chest congestion.   Chills.   Shortness of breath.   Sore throat.  DIAGNOSIS  Bronchitis is usually diagnosed through a medical history and physical exam. Tests, such as chest X-rays, are sometimes done to rule out other conditions.  TREATMENT  You may need to avoid contact with whatever caused the problem (smoking, for example). Medicines are sometimes needed. These may include:  Antibiotics. These may be prescribed if the condition is caused by bacteria.  Cough suppressants. These may be prescribed for relief of cough symptoms.   Inhaled medicines. These may be prescribed to help open your airways and make it easier for you to breathe.   Steroid medicines. These may be prescribed for those with recurrent (chronic) bronchitis. HOME CARE INSTRUCTIONS  Get plenty of rest.   Drink enough fluids to keep your urine clear or pale yellow (unless you have a medical condition that requires fluid restriction). Increasing fluids may help thin your secretions and will prevent dehydration.   Only take over-the-counter or prescription medicines as directed  by your health care provider.  Only take antibiotics as directed. Make sure you finish them even if you start to feel better.  Avoid secondhand smoke, irritating chemicals, and strong fumes. These will make bronchitis worse. If you are a smoker, quit smoking. Consider using nicotine gum or skin patches to help control withdrawal symptoms. Quitting smoking will help your lungs heal faster.   Put a cool-mist humidifier in your bedroom at night to moisten the air. This may help loosen mucus. Change the water in the humidifier daily. You can also run the hot water in your shower and sit in the bathroom with the door closed for 5 10 minutes.   Follow up with your health care provider as directed.   Wash your hands frequently to avoid catching bronchitis again or spreading an infection to others.  SEEK MEDICAL CARE IF: Your symptoms do not improve after 1 week of treatment.  SEEK IMMEDIATE MEDICAL CARE IF:  Your fever increases.  You have chills.   You have chest pain.   You have worsening shortness of breath.   You have bloody sputum.  You faint.  You have lightheadedness.  You have a severe headache.   You vomit repeatedly. MAKE SURE YOU:   Understand these instructions.  Will watch your condition.  Will get help right away if you are not doing well or get worse. Document Released: 05/13/2005 Document Revised: 03/03/2013 Document Reviewed: 01/05/2013 [facility] LPExit[facility] Patient Information 2014 [place], [place].

## 2013-08-18 NOTE — ED Provider Notes (Signed)
Medical screening examination/treatment/procedure(s) were performed by non-physician practitioner and as supervising physician I was immediately available for consultation/collaboration.   EKG Interpretation None      Devoria AlbeIva Bernarr Longsworth, MD, Armando GangFACEP   Ward GivensIva L Kielee Care, MD 08/18/13 2225

## 2013-08-18 NOTE — ED Notes (Signed)
Pt c/o nasal congestion/cough x 3 weeks, worse since last night, difficultly sleeping d/t cough. Harsh dry cough noted.

## 2013-08-18 NOTE — ED Provider Notes (Signed)
CSN: [id number]     Arrival date & time 08/18/13  2040 History  This chart was scribed for non-physician practitioner, Ivonne Andrew, PA-C working with Ward Givens, MD by Luisa Dago, ED scribe. This patient was seen in room WTR6/WTR6 and the patient's care was started at 10:01 PM.    Chief Complaint  Patient presents with  . URI   The history is provided by the patient. No language interpreter was used.   HPI Comments: [name] is a 28 y.o. female who presents to the Emergency Department complaining of worsening nasal congestion that started 3 weeks ago. Pt is also complaining of associated dry cough, SOB (secondary to congestion), and sleep disturbances secondary to cough. She states that 4 days ago she took Robitussin cold and allergy medicine with no relief. Pt states that today when she coughed she could feel a metallic aftertaste. Denies hemoptysis.  She states that she has had one sick contact. No recent travel.  Denies any history of asthma. Denies any abdominal pain, or chest pain.   Past Medical History  Diagnosis Date  . No pertinent past medical history   . Anxiety   . Chlamydia   . Trichomonas   . Herpes    Past Surgical History  Procedure Laterality Date  . Cesarean section    . Wisdom tooth extraction     Family History  Problem Relation Age of Onset  . Adopted: Yes  . Other Neg Hx    History  Substance Use Topics  . Smoking status: Former Smoker -- 1.50 packs/day    Types: Cigarettes  . Smokeless tobacco: Never Used  . Alcohol Use: No   OB History   Grav Para Term Preterm Abortions TAB SAB Ect Mult Living   2 1 1  1  1   1      Review of Systems  Constitutional: Negative for fever, chills and diaphoresis.  HENT: Positive for congestion.   Respiratory: Positive for cough and shortness of breath (secondary to congestion).   Gastrointestinal: Negative for nausea, vomiting and abdominal pain.  Psychiatric/Behavioral: Positive for sleep disturbance  (secondary to cough).  All other systems reviewed and are negative.      Allergies  Amoxicillin; Amoxapine and related; Clindamycin/lincomycin; and Metronidazole  Home Medications   Current Outpatient Rx  Name  Route  Sig  Dispense  Refill  . guaiFENesin (ROBITUSSIN) 100 MG/5ML SOLN   Oral   Take 5 mLs by mouth every 4 (four) hours as needed for cough.         . phenylephrine (SUDAFED PE) 10 MG TABS tablet   Oral   Take 10 mg by mouth every 4 (four) hours as needed (for sinus congestion).         . Phenylephrine-Acetaminophen (VICKS DAYQUIL SINUS) 5-325 MG CAPS   Oral   Take 2 capsules by mouth as needed (for cold symptoms).          BP 138/70  Pulse 102  Temp(Src) 98.7 F (37.1 C) (Oral)  Resp 16  Ht 5\' 5"  (1.651 m)  Wt 178 lb (80.74 kg)  BMI 29.62 kg/m2  SpO2 100%  LMP 08/11/2013  Physical Exam  Nursing note and vitals reviewed. Constitutional: She is oriented to person, place, and time. She appears well-developed and well-nourished. No distress.  HENT:  Head: Normocephalic and atraumatic.  Right Ear: Tympanic membrane normal.  Left Ear: Tympanic membrane normal.  Mouth/Throat: Oropharynx is clear and moist.  Eyes: Conjunctivae are  normal. Right eye exhibits no discharge. Left eye exhibits no discharge.  Neck: Normal range of motion. Neck supple.  No meningeal signs  Cardiovascular: Normal rate, regular rhythm and normal heart sounds.  Exam reveals no gallop and no friction rub.   No murmur heard. Pulmonary/Chest: Effort normal and breath sounds normal. No respiratory distress. She has no wheezes. She has no rales.  Abdominal: Soft. She exhibits no distension. There is no tenderness.  Musculoskeletal: She exhibits no edema and no tenderness.  Neurological: She is alert and oriented to person, place, and time.  Skin: Skin is warm and dry. No rash noted.  Psychiatric: She has a normal mood and affect. Her behavior is normal. Thought content normal.     ED Course  Procedures  DIAGNOSTIC STUDIES: Oxygen Saturation is 100% on RA, normal by my interpretation.    COORDINATION OF CARE: 10:08 PM- Advised pt to drink plenty of fluids. Will prescribe medication. Pt advised of plan for treatment and pt agrees.  Imaging Review Dg Chest 2 View  08/18/2013   CLINICAL DATA:  Productive cough  EXAM: CHEST  2 VIEW  COMPARISON:  None.  FINDINGS: The heart size and mediastinal contours are within normal limits. Both lungs are clear. The visualized skeletal structures are unremarkable.  IMPRESSION: No active cardiopulmonary disease.   Electronically Signed   By: Alcide CleverMark  Lukens M.D.   On: 08/18/2013 21:47    MDM   Final diagnoses:  Bronchitis    I personally performed the services described in this documentation, which was scribed in my presence. The recorded information has been reviewed and is accurate.    Angus Sellereter S Kaymarie Wynn, PA-C 08/18/13 2219

## 2013-09-24 ENCOUNTER — Inpatient Hospital Stay (HOSPITAL_COMMUNITY)
Admission: AD | Admit: 2013-09-24 | Discharge: 2013-09-24 | Disposition: A | Discharge status: Home / Self Care | Payer: 59 | Source: Ambulatory Visit | Attending: Obstetrics & Gynecology | Admitting: Obstetrics & Gynecology

## 2013-09-24 ENCOUNTER — Encounter (HOSPITAL_COMMUNITY): Payer: Self-pay | Admitting: *Deleted

## 2013-09-24 DIAGNOSIS — Z3202 Encounter for pregnancy test, result negative: Secondary | ICD-10-CM | POA: Insufficient documentation

## 2013-09-24 DIAGNOSIS — R110 Nausea: Secondary | ICD-10-CM | POA: Insufficient documentation

## 2013-09-24 DIAGNOSIS — R109 Unspecified abdominal pain: Secondary | ICD-10-CM | POA: Insufficient documentation

## 2013-09-24 DIAGNOSIS — F17200 Nicotine dependence, unspecified, uncomplicated: Secondary | ICD-10-CM | POA: Insufficient documentation

## 2013-09-24 LAB — URINALYSIS, ROUTINE W REFLEX MICROSCOPIC
BILIRUBIN URINE: NEGATIVE
GLUCOSE, UA: NEGATIVE mg/dL
Hgb urine dipstick: NEGATIVE
KETONES UR: NEGATIVE mg/dL
Leukocytes, UA: NEGATIVE
NITRITE: NEGATIVE
PH: 7 (ref 5.0–8.0)
Protein, ur: NEGATIVE mg/dL
Urobilinogen, UA: 0.2 mg/dL (ref 0.0–1.0)

## 2013-09-24 LAB — POCT PREGNANCY, URINE: Preg Test, Ur: NEGATIVE

## 2013-09-24 LAB — WET PREP, GENITAL
TRICH WET PREP: NONE SEEN
YEAST WET PREP: NONE SEEN

## 2013-09-24 NOTE — MAU Provider Note (Signed)
Attestation of Attending Supervision of Advanced Practitioner (CNM/NP): Evaluation and management procedures were performed by the Advanced Practitioner under my supervision and collaboration.  I have reviewed the Advanced Practitioner's note and chart, and I agree with the management and plan.  Doris Mcgilvery 09/24/2013 7:33 PM

## 2013-09-24 NOTE — MAU Note (Signed)
PT SAYS SHE  STARTED CYCLE ON 4-1- THEN STOPPED ON  4-7 THEN HAD SEX ON 4-8-    THEN ON 4-9  SNE HAD   VAG BLEEDING WHEN SHE WIPED.       [facility]  BUT MORE IN April.   SAYS SHE HAS TROUBLE  HOLDING URINE.   SOMETIMES  SHE FEELS STABBING PAIN ON LEFT SIDE   AND FEELS DULL PAIN ON RIGHT SIDE.    FEELS NAUSEA-    STARTED LAST WEEK-    VOMITED   AT  0530.      NO BIRTH CONTROL.    NO HPT.     OTHER BABY-    DR Jackelyn KnifeMEISINGER.  - NO GYN NOW- NO INSURANCE

## 2013-09-24 NOTE — Discharge Instructions (Signed)
Safe Sex Safe sex is about reducing the risk of giving or getting a sexually transmitted disease (STD). STDs are spread through sexual contact involving the genitals, mouth, or rectum. Some STDS can be cured and others cannot. Safe sex can also prevent unintended pregnancies.  SAFE SEX PRACTICES  Limit your sexual activity to only one partner who is only having sex with you.  Talk to your partner about their past partners, past STDs, and drug use.  Use a condom every time you have sexual intercourse. This includes vaginal, oral, and anal sexual activity. Both females and males should wear condoms during oral sex. Only use latex or polyurethane condoms and water-based lubricants. Petroleum-based lubricants or oils used to lubricate a condom will weaken the condom and increase the chance that it will break. The condom should be in place from the beginning to the end of sexual activity. Wearing a condom reduces, but does not completely eliminate, your risk of getting or giving a STD. STDs can be spread by contact with skin of surrounding areas.  Get vaccinated for hepatitis B and HPV.  Avoid alcohol and recreational drugs which can affect your judgement. You may forget to use a condom or participate in high-risk sex.  For females, avoid douching after sexual intercourse. Douching can spread an infection farther into the reproductive tract.  Check your body for signs of sores, blisters, rashes, or unusual discharge. See your caregiver if you notice any of these signs.  Avoid sexual contact if you have symptoms of an infection or are being treated for an STD. If you or your partner has herpes, avoid sexual contact when blisters are present. Use condoms at all other times.  See your caregiver for regular screenings, examinations, and tests for STDs. Before having sex with a new partner, each of you should be screened for STDs and talk about the results with your partner. BENEFITS OF SAFE SEX   There  is less of a chance of getting or giving an STD.  You can prevent unwanted or unintended pregnancies.  By discussing safer sex concerns with your partner, you may increase feelings of intimacy, comfort, trust, and honesty between the both of you. Document Released: 06/20/2004 Document Revised: 02/05/2012 Document Reviewed: 11/04/2011 [facility] Patient Information 2014 [place], [place]. Contraception Choices Contraception (birth control) is the use of any methods or devices to prevent pregnancy. Below are some methods to help avoid pregnancy. HORMONAL METHODS   Contraceptive implant This is a thin, plastic tube containing progesterone hormone. It does not contain estrogen hormone. Your health care provider inserts the tube in the inner part of the upper arm. The tube can remain in place for up to 3 years. After 3 years, the implant must be removed. The implant prevents the ovaries from releasing an egg (ovulation), thickens the cervical mucus to prevent sperm from entering the uterus, and thins the lining of the inside of the uterus.  Progesterone-only injections These injections are given every 3 months by your health care provider to prevent pregnancy. This synthetic progesterone hormone stops the ovaries from releasing eggs. It also thickens cervical mucus and changes the uterine lining. This makes it harder for sperm to survive in the uterus.  Birth control pills These pills contain estrogen and progesterone hormone. They work by preventing the ovaries from releasing eggs (ovulation). They also cause the cervical mucus to thicken, preventing the sperm from entering the uterus. Birth control pills are prescribed by a health care provider.Birth control pills can also  be used to treat heavy periods.  Minipill This type of birth control pill contains only the progesterone hormone. They are taken every day of each month and must be prescribed by your health care provider.  Birth control patch The  patch contains hormones similar to those in birth control pills. It must be changed once a week and is prescribed by a health care provider.  Vaginal ring The ring contains hormones similar to those in birth control pills. It is left in the vagina for 3 weeks, removed for 1 week, and then a new one is put back in place. The patient must be comfortable inserting and removing the ring from the vagina.A health care provider's prescription is necessary.  Emergency contraception Emergency contraceptives prevent pregnancy after unprotected sexual intercourse. This pill can be taken right after sex or up to 5 days after unprotected sex. It is most effective the sooner you take the pills after having sexual intercourse. Most emergency contraceptive pills are available without a prescription. Check with your pharmacist. Do not use emergency contraception as your only form of birth control. BARRIER METHODS   Female condom This is a thin sheath (latex or rubber) that is worn over the penis during sexual intercourse. It can be used with spermicide to increase effectiveness.  Female condom. This is a soft, loose-fitting sheath that is put into the vagina before sexual intercourse.  Diaphragm This is a soft, latex, dome-shaped barrier that must be fitted by a health care provider. It is inserted into the vagina, along with a spermicidal jelly. It is inserted before intercourse. The diaphragm should be left in the vagina for 6 to 8 hours after intercourse.  Cervical cap This is a round, soft, latex or plastic cup that fits over the cervix and must be fitted by a health care provider. The cap can be left in place for up to 48 hours after intercourse.  Sponge This is a soft, circular piece of polyurethane foam. The sponge has spermicide in it. It is inserted into the vagina after wetting it and before sexual intercourse.  Spermicides These are chemicals that kill or block sperm from entering the cervix and uterus.  They come in the form of creams, jellies, suppositories, foam, or tablets. They do not require a prescription. They are inserted into the vagina with an applicator before having sexual intercourse. The process must be repeated every time you have sexual intercourse. INTRAUTERINE CONTRACEPTION  Intrauterine device (IUD) This is a T-shaped device that is put in a woman's uterus during a menstrual period to prevent pregnancy. There are 2 types:  Copper IUD This type of IUD is wrapped in copper wire and is placed inside the uterus. Copper makes the uterus and fallopian tubes produce a fluid that kills sperm. It can stay in place for 10 years.  Hormone IUD This type of IUD contains the hormone progestin (synthetic progesterone). The hormone thickens the cervical mucus and prevents sperm from entering the uterus, and it also thins the uterine lining to prevent implantation of a fertilized egg. The hormone can weaken or kill the sperm that get into the uterus. It can stay in place for 3 5 years, depending on which type of IUD is used. PERMANENT METHODS OF CONTRACEPTION  Female tubal ligation This is when the woman's fallopian tubes are surgically sealed, tied, or blocked to prevent the egg from traveling to the uterus.  Hysteroscopic sterilization This involves placing a small coil or insert into each fallopian tube.  Your doctor uses a technique called hysteroscopy to do the procedure. The device causes scar tissue to form. This results in permanent blockage of the fallopian tubes, so the sperm cannot fertilize the egg. It takes about 3 months after the procedure for the tubes to become blocked. You must use another form of birth control for these 3 months.  Female sterilization This is when the female has the tubes that carry sperm tied off (vasectomy).This blocks sperm from entering the vagina during sexual intercourse. After the procedure, the man can still ejaculate fluid (semen). NATURAL PLANNING  METHODS  Natural family planning This is not having sexual intercourse or using a barrier method (condom, diaphragm, cervical cap) on days the woman could become pregnant.  Calendar method This is keeping track of the length of each menstrual cycle and identifying when you are fertile.  Ovulation method This is avoiding sexual intercourse during ovulation.  Symptothermal method This is avoiding sexual intercourse during ovulation, using a thermometer and ovulation symptoms.  Post ovulation method This is timing sexual intercourse after you have ovulated. Regardless of which type or method of contraception you choose, it is important that you use condoms to protect against the transmission of sexually transmitted infections (STIs). Talk with your health care provider about which form of contraception is most appropriate for you. Document Released: 05/13/2005 Document Revised: 01/13/2013 Document Reviewed: 11/05/2012 [facility] Patient Information 2014 [place].

## 2013-09-24 NOTE — MAU Provider Note (Signed)
CC: No chief complaint on file.    First Provider Initiated Contact with Patient 09/24/13 [phone]      HPI [name] is a 28 y.o. G2P1011 who is concerned she may be pregnant since she had unprotected intercourse 09/01/13 and had nausea over the past week. No HPT done. LMP 08/25/2013. Reports fleeting intermittent sharp left suprapubic abdominal pain since March. She's not having nausea or pain now. Denies abnormal bleeding. No irritative vaginal discharge but did have malodor a week or so ago. Concerned she may have a UTI because she holds her urine. Has had some urgency but no dysuria, frequency, hematuria. No dyspareunia. Has 1 sexual partner. Was on Mirena for contraception briefly and would like to get started on another method.  Past Medical History  Diagnosis Date  . No pertinent past medical history   . Anxiety   . Chlamydia   . Trichomonas   . Herpes     OB History  Gravida Para Term Preterm AB SAB TAB Ectopic Multiple Living  2 1 1  1 1    1     # Outcome Date GA Lbr Len/2nd Weight Sex Delivery Anes PTL Lv  2 TRM 08/22/09    M LTCS     1 SAB 2009              Past Surgical History  Procedure Laterality Date  . Cesarean section    . Wisdom tooth extraction      History   Social History  . Marital Status: Single    Spouse Name: N/A    Number of Children: N/A  . Years of Education: N/A   Occupational History  . Not on file.   Social History Main Topics  . Smoking status: Current Every Day Smoker -- 0.25 packs/day    Types: Cigarettes  . Smokeless tobacco: Never Used  . Alcohol Use: No  . Drug Use: No  . Sexual Activity: Yes    Birth Control/ Protection: IUD   Other Topics Concern  . Not on file   Social History Narrative  . No narrative on file    No current facility-administered medications on file prior to encounter.   No current outpatient prescriptions on file prior to encounter.    Allergies  Allergen Reactions  . Amoxicillin Other  (See Comments)    Skin peeling  . Amoxapine And Related Other (See Comments)    Causes skin to peel.  . Clindamycin/Lincomycin Other (See Comments)    Skin peels on perineum, lower pubic area, and groin. Same reaction as  amoxicillin  . Metronidazole     Skin peels    ROS Pertinent items in HPI. Denies fever, chills, vomiting, constipation, diarrhea. Hasrespiratory allergies with UR congestion, on Claritin.  PHYSICAL EXAM Filed Vitals:   09/24/13 0705  BP: 110/62  Pulse: 90  Temp: 98.6 F (37 C)  Resp: 20   General: Well nourished, well developed female in no acute distress HEENT: UR congestion, loose cough Cardiovascular: Normal rate Respiratory: Normal effort; lungs CTA Abdomen: Soft, nontender throughout; healed Pfannensteil scar.  Back: No CVAT Extremities: No edema Neurologic: Alert and oriented Speculum exam: NEFG; vagina with thick white discharge, no blood; cervix clean Bimanual exam: cervix closed, no CMT; uterus NSSP; no adnexal tenderness or masses   LAB RESULTS Results for orders placed during the hospital encounter of 09/24/13 (from the past 24 hour(s))  URINALYSIS, ROUTINE W REFLEX MICROSCOPIC     Status: Abnormal   Collection  Time    09/24/13  7:06 AM      Result Value Ref Range   Color, Urine YELLOW  YELLOW   APPearance CLEAR  CLEAR   Specific Gravity, Urine <1.005 (*) 1.005 - 1.030   pH 7.0  5.0 - 8.0   Glucose, UA NEGATIVE  NEGATIVE mg/dL   Hgb urine dipstick NEGATIVE  NEGATIVE   Bilirubin Urine NEGATIVE  NEGATIVE   Ketones, ur NEGATIVE  NEGATIVE mg/dL   Protein, ur NEGATIVE  NEGATIVE mg/dL   Urobilinogen, UA 0.2  0.0 - 1.0 mg/dL   Nitrite NEGATIVE  NEGATIVE   Leukocytes, UA NEGATIVE  NEGATIVE  POCT PREGNANCY, URINE     Status: None   Collection Time    09/24/13  7:12 AM      Result Value Ref Range   Preg Test, Ur NEGATIVE  NEGATIVE  WET PREP, GENITAL     Status: Abnormal   Collection Time    09/24/13  8:32 AM      Result Value Ref Range    Yeast Wet Prep HPF POC NONE SEEN  NONE SEEN   Trich, Wet Prep NONE SEEN  NONE SEEN   Clue Cells Wet Prep HPF POC FEW (*) NONE SEEN   WBC, Wet Prep HPF POC FEW (*) NONE SEEN    IMAGING No results found.  MAU COURSE   ASSESSMENT  1. Pain, abdominal, nonspecific   Normal exam, nonpregnant  PLAN Discharge home with reassurance she is not pregnant and no UTI.  Check HPT in 1 wk if no menses See AVS for patient education: safe sex, contraceptive choices Try Tylenol if pain recurs.   Medication List         loratadine 10 MG tablet  Commonly known as:  CLARITIN  Take 10 mg by mouth daily as needed for allergies.       Follow-up Information   Follow up with [facility]. (Someone from Clinic will call you with appt.)    Contact information:   [address] [place] [id number] [phone]        Danae OrleansDeirdre C Jennell Janosik, CNM 09/24/2013 8:17 AM

## 2013-09-25 LAB — GC/CHLAMYDIA PROBE AMP
CT Probe RNA: NEGATIVE
GC PROBE AMP APTIMA: NEGATIVE

## 2013-10-17 ENCOUNTER — Emergency Department (HOSPITAL_COMMUNITY)
Admission: EM | Admit: 2013-10-17 | Discharge: 2013-10-17 | Disposition: A | Discharge status: Home / Self Care | Payer: 59 | Attending: Emergency Medicine | Admitting: Emergency Medicine

## 2013-10-17 ENCOUNTER — Encounter (HOSPITAL_COMMUNITY): Payer: Self-pay | Admitting: Emergency Medicine

## 2013-10-17 DIAGNOSIS — R209 Unspecified disturbances of skin sensation: Secondary | ICD-10-CM | POA: Insufficient documentation

## 2013-10-17 DIAGNOSIS — Z880 Allergy status to penicillin: Secondary | ICD-10-CM | POA: Insufficient documentation

## 2013-10-17 DIAGNOSIS — M79604 Pain in right leg: Secondary | ICD-10-CM

## 2013-10-17 DIAGNOSIS — Z8619 Personal history of other infectious and parasitic diseases: Secondary | ICD-10-CM | POA: Insufficient documentation

## 2013-10-17 DIAGNOSIS — F17200 Nicotine dependence, unspecified, uncomplicated: Secondary | ICD-10-CM | POA: Insufficient documentation

## 2013-10-17 DIAGNOSIS — M549 Dorsalgia, unspecified: Secondary | ICD-10-CM | POA: Insufficient documentation

## 2013-10-17 DIAGNOSIS — Z8659 Personal history of other mental and behavioral disorders: Secondary | ICD-10-CM | POA: Insufficient documentation

## 2013-10-17 DIAGNOSIS — M25579 Pain in unspecified ankle and joints of unspecified foot: Secondary | ICD-10-CM | POA: Insufficient documentation

## 2013-10-17 DIAGNOSIS — R609 Edema, unspecified: Secondary | ICD-10-CM | POA: Insufficient documentation

## 2013-10-17 DIAGNOSIS — M25569 Pain in unspecified knee: Secondary | ICD-10-CM | POA: Insufficient documentation

## 2013-10-17 DIAGNOSIS — R269 Unspecified abnormalities of gait and mobility: Secondary | ICD-10-CM | POA: Insufficient documentation

## 2013-10-17 MED ORDER — IBUPROFEN 800 MG PO TABS
800.0000 mg | ORAL_TABLET | Freq: Three times a day (TID) | ORAL | Status: DC
Start: 2013-10-17 — End: 2013-10-19

## 2013-10-17 MED ORDER — METHOCARBAMOL 500 MG PO TABS: 500.0000 mg | ORAL_TABLET | Freq: Two times a day (BID) | ORAL | Status: DC

## 2013-10-17 NOTE — Discharge Instructions (Signed)
RICE: Routine Care for Injuries The routine care of many injuries includes Rest, Ice, Compression, and Elevation (RICE). HOME CARE INSTRUCTIONS  Rest is needed to allow your body to heal. Routine activities can usually be resumed when comfortable. Injured tendons and bones can take up to 6 weeks to heal. Tendons are the cord-like structures that attach muscle to bone.  Ice following an injury helps keep the swelling down and reduces pain.  Put ice in a plastic bag.  Place a towel between your skin and the bag.  Leave the ice on for 15-20 minutes, 03-04 times a day. Do this while awake, for the first 24 to 48 hours. After that, continue as directed by your caregiver.  Compression helps keep swelling down. It also gives support and helps with discomfort. If an elastic bandage has been applied, it should be removed and reapplied every 3 to 4 hours. It should not be applied tightly, but firmly enough to keep swelling down. Watch fingers or toes for swelling, bluish discoloration, coldness, numbness, or excessive pain. If any of these problems occur, remove the bandage and reapply loosely. Contact your caregiver if these problems continue.  Elevation helps reduce swelling and decreases pain. With extremities, such as the arms, hands, legs, and feet, the injured area should be placed near or above the level of the heart, if possible. SEEK IMMEDIATE MEDICAL CARE IF:  You have persistent pain and swelling.  You develop redness, numbness, or unexpected weakness.  Your symptoms are getting worse rather than improving after several days. These symptoms may indicate that further evaluation or further X-rays are needed. Sometimes, X-rays may not show a small broken bone (fracture) until 1 week or 10 days later. Make a follow-up appointment with your caregiver. Ask when your X-ray results will be ready. Make sure you get your X-ray results. Document Released: 08/25/2000 Document Revised: 08/05/2011  Document Reviewed: 10/12/2010 [employer] Patient Information 2014 [employer].  

## 2013-10-17 NOTE — ED Provider Notes (Signed)
CSN: [id number]     Arrival date & time 10/17/13  1019 History   This chart was scribed for non-physician practitioner, Fayrene HelperBowie Mauri Tolen , working with Richardean Canalavid H Yao, MD, by Tana ConchStephen Methvin ED Scribe. This patient was seen in TR08C/TR08C and the patient's care was started at 10:31 AM.    Chief Complaint  Patient presents with  . Knee Pain  . Foot Pain      The history is provided by the patient. No language interpreter was used.    HPI Comments: [name] is a 28 y.o. 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 She states that her knee has been "popping" more recently, "like cracking knuckles". She also reports right foot pain and "numbness". Her toes are "tingling" and this feeling extends toward her ankle, "it feels like my foot is asleep". She reports that it is worsening by bearing weight and with flexion. She has soaked her knee in epsom salt and reports no effect. Pt is ambulatory. Pt reports that she has not traveled anywhere recently, she has not had any recent surgery, and has no history of cancer.  Pt with a h/o back pain reports that her back is tender.   Past Medical History  Diagnosis Date  . No pertinent past medical history   . Anxiety   . Chlamydia   . Trichomonas   . Herpes    Past Surgical History  Procedure Laterality Date  . Cesarean section    . Wisdom tooth extraction     Family History  Problem Relation Age of Onset  . Adopted: Yes  . Other Neg Hx    History  Substance Use Topics  . Smoking status: Current Every Day Smoker -- 0.25 packs/day    Types: Cigarettes  . Smokeless tobacco: Never Used  . Alcohol Use: No   OB History   Grav Para Term Preterm Abortions TAB SAB Ect Mult Living   2 1 1  1  1   1      Review of Systems  Constitutional: Negative for fever, chills and activity change.  Respiratory:  Negative for cough, choking and shortness of breath.   Cardiovascular: Negative for chest pain.  Gastrointestinal: Negative for abdominal pain.  Musculoskeletal: Positive for arthralgias, back pain, gait problem and joint swelling (right knee). Negative for myalgias, neck pain and neck stiffness.  Neurological: Positive for numbness (mild right foot). Negative for dizziness, seizures, syncope, weakness, light-headedness and headaches.  Psychiatric/Behavioral: Negative for confusion.      Allergies  Amoxicillin; Amoxapine and related; Clindamycin/lincomycin; and Metronidazole  Home Medications   Prior to Admission medications   Medication Sig Start Date End Date Taking? Authorizing Provider  loratadine (CLARITIN) 10 MG tablet Take 10 mg by mouth daily as needed for allergies.    Historical Provider, MD   BP 116/70  Pulse 103  Temp(Src) 98.7 F (37.1 C) (Oral)  Resp 18  Ht 5\' 5"  (1.651 m)  Wt 175 lb (79.379 kg)  BMI 29.12 kg/m2  SpO2 100%  LMP 08/25/2013 Physical Exam  Nursing note and vitals reviewed. Constitutional: She is oriented to person, place, and time. She appears well-developed and well-nourished.  HENT:  Head: Normocephalic and atraumatic.  Eyes: Conjunctivae and EOM are normal. Pupils are equal, round, and reactive to light.  Neck: Normal range of motion. Neck supple.  Cardiovascular: Normal  rate, regular rhythm, normal heart sounds and intact distal pulses.   Pulmonary/Chest: Effort normal and breath sounds normal.  Abdominal: Soft. Bowel sounds are normal.  Musculoskeletal: Normal range of motion. She exhibits edema (right knee) and tenderness.       Right knee: She exhibits normal range of motion, no erythema, no LCL laxity and no MCL laxity. Tenderness found.  Bilateral lower extremities negative Homans sign, DP intact, no erythema, no edema.  No focal tenderness to the right foot  Tenderness to suprapatellar area of right knee. Negative anterior draw test.  Negative varus and valgus maneuver. No obvious deformity. No joint laxity and the patella appears to be in place. Normal flexion and extension of her knee.  Left para lumbar spine tenderness, spine is midline. No crepitus or step off.  Neurological: She is alert and oriented to person, place, and time.  Skin: Skin is warm and dry.  Psychiatric: She has a normal mood and affect. Her behavior is normal.    ED Course  Procedures (including critical care time)  DIAGNOSTIC STUDIES: Oxygen Saturation is 100% on RA, norma by my interpretation.    COORDINATION OF CARE:   10:42 AM-Discussed treatment plan which includes checking the pulse in her right foot with a machine,  with pt at bedside and pt agreed to plan. Measured DP in her foot using bedside doppler, pulse is fine.  Given her recent strenuous work activity, suspect arthritic MSK pain.  Return precaution given.   Labs Review Labs Reviewed - No data to display  Imaging Review No results found.   EKG Interpretation None      MDM   Final diagnoses:  Right leg pain    BP 116/70  Pulse 103  Temp(Src) 98.7 F (37.1 C) (Oral)  Resp 18  Ht 5\' 5"  (1.651 m)  Wt 175 lb (79.379 kg)  BMI 29.12 kg/m2  SpO2 100%  LMP 08/25/2013   I personally performed the services described in this documentation, which was scribed in my presence. The recorded information has been reviewed and is accurate.    Fayrene Helper, PA-C 10/17/13 1059

## 2013-10-17 NOTE — ED Notes (Addendum)
She woke yesterday with R knee pain and swelling then today with R foot swelling and numbness. She states she has noticed her knee "feels like its popping sometimes" while walking over the past few weeks. Pulses intact, can wiggle digits, skin w/d.

## 2013-10-18 NOTE — ED Provider Notes (Signed)
Medical screening examination/treatment/procedure(s) were performed by non-physician practitioner and as supervising physician I was immediately available for consultation/collaboration.   EKG Interpretation None        David H Yao, MD 10/18/13 1504 

## 2013-10-19 ENCOUNTER — Emergency Department (HOSPITAL_COMMUNITY): Payer: 59

## 2013-10-19 ENCOUNTER — Encounter (HOSPITAL_COMMUNITY): Payer: Self-pay | Admitting: Emergency Medicine

## 2013-10-19 ENCOUNTER — Emergency Department (HOSPITAL_COMMUNITY)
Admission: EM | Admit: 2013-10-19 | Discharge: 2013-10-19 | Disposition: A | Discharge status: Home / Self Care | Payer: Self-pay | Attending: Emergency Medicine | Admitting: Emergency Medicine

## 2013-10-19 DIAGNOSIS — M79609 Pain in unspecified limb: Secondary | ICD-10-CM

## 2013-10-19 DIAGNOSIS — Z8659 Personal history of other mental and behavioral disorders: Secondary | ICD-10-CM | POA: Insufficient documentation

## 2013-10-19 DIAGNOSIS — Z880 Allergy status to penicillin: Secondary | ICD-10-CM | POA: Insufficient documentation

## 2013-10-19 DIAGNOSIS — Z8619 Personal history of other infectious and parasitic diseases: Secondary | ICD-10-CM | POA: Insufficient documentation

## 2013-10-19 DIAGNOSIS — M79604 Pain in right leg: Secondary | ICD-10-CM

## 2013-10-19 DIAGNOSIS — I8001 Phlebitis and thrombophlebitis of superficial vessels of right lower extremity: Secondary | ICD-10-CM

## 2013-10-19 DIAGNOSIS — I8000 Phlebitis and thrombophlebitis of superficial vessels of unspecified lower extremity: Secondary | ICD-10-CM | POA: Insufficient documentation

## 2013-10-19 DIAGNOSIS — Z79899 Other long term (current) drug therapy: Secondary | ICD-10-CM | POA: Insufficient documentation

## 2013-10-19 DIAGNOSIS — F17200 Nicotine dependence, unspecified, uncomplicated: Secondary | ICD-10-CM | POA: Insufficient documentation

## 2013-10-19 DIAGNOSIS — Z791 Long term (current) use of non-steroidal anti-inflammatories (NSAID): Secondary | ICD-10-CM | POA: Insufficient documentation

## 2013-10-19 MED ORDER — ASPIRIN 81 MG PO CHEW: 324.0000 mg | CHEWABLE_TABLET | Freq: Every day | ORAL | Status: DC

## 2013-10-19 MED ORDER — TRAMADOL HCL 50 MG PO TABS: 50.0000 mg | ORAL_TABLET | Freq: Four times a day (QID) | ORAL | Status: DC | PRN

## 2013-10-19 MED ORDER — IBUPROFEN 400 MG PO TABS
800.0000 mg | ORAL_TABLET | Freq: Once | ORAL | Status: AC
  Administered 2013-10-19: 800 mg via ORAL
  Filled 2013-10-19: qty 2

## 2013-10-19 MED ORDER — NAPROXEN 500 MG PO TABS: 500.0000 mg | ORAL_TABLET | Freq: Two times a day (BID) | ORAL | Status: DC

## 2013-10-19 NOTE — ED Notes (Signed)
Pt back from vascular lab

## 2013-10-19 NOTE — ED Notes (Signed)
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

## 2013-10-19 NOTE — ED Provider Notes (Signed)
Medical screening examination/treatment/procedure(s) were performed by non-physician practitioner and as supervising physician I was immediately available for consultation/collaboration.   EKG Interpretation None       Courtney F Horton, MD 10/19/13 1922 

## 2013-10-19 NOTE — Discharge Instructions (Signed)
Warm compresses, aspirin for the clot. Keep leg elevated. Naprosyn and ultram for pain. Follow up with primary care doctor.   Phlebitis Phlebitis is soreness and swelling (inflammation) of a vein. This can occur in your arms, legs, or torso (trunk), as well as deeper inside your body. Phlebitis is usually not serious when it occurs close to the surface of the body. However, it can cause serious problems when it occurs in a vein deeper inside the body. CAUSES  Phlebitis can be triggered by various things, including:   Reduced blood flow through your veins. This can happen with:  Bed rest over a long period.  Long-distance travel.  Injury.  Surgery.  Being overweight (obese) or pregnant.  Having an IV tube put in the vein and getting certain medicines through the vein.  Cancer and cancer treatment.  Use of illegal drugs taken through the vein.  Inflammatory diseases.  Inherited (genetic) diseases that increase the risk of blood clots.  Hormone therapy, such as birth control pills. SIGNS AND SYMPTOMS   Red, tender, swollen, and painful area on your skin. Usually, the area will be long and narrow.  Firmness along the center of the affected area. This can indicate that a blood clot has formed.  Low-grade fever. DIAGNOSIS  A health care provider can usually diagnose phlebitis by examining the affected area and asking about your symptoms. To check for infection or blood clots, your health care provider may order blood tests or an ultrasound exam of the area. Blood tests and your family history may also indicate if you have an underlying genetic disease that causes blood clots. Occasionally, a piece of tissue is taken from the body (biopsy sample) if an unusual cause of phlebitis is suspected. TREATMENT  Treatment will vary depending on the severity of the condition and the area of the body affected. Treatment may include:  Use of a warm compress or heating pad.  Use of  compression stockings or bandages.  Anti-inflammatory medicines.  Removal of any IV tube that may be causing the problem.  Medicines that kill germs (antibiotics) if an infection is present.  Blood-thinning medicines if a blood clot is suspected or present.  In rare cases, surgery may be needed to remove damaged sections of vein. HOME CARE INSTRUCTIONS   Only take over-the-counter or prescription medicines as directed by your health care provider. Take all medicines exactly as prescribed.  Raise (elevate) the affected area above the level of your heart as directed by your health care provider.  Apply a warm compress or heating pad to the affected area as directed by your health care provider. Do not sleep with the heating pad.  Use compression stockings or bandages as directed. These will speed healing and prevent the condition from coming back.  If you are on blood thinners:  Get follow-up blood tests as directed by your health care provider.  Check with your health care provider before using any new medicines.  Carry a medical alert card or wear your medical alert jewelry to show that you are on blood thinners.  For phlebitis in the legs:  Avoid prolonged standing or bed rest.  Keep your legs moving. Raise your legs when sitting or lying.  Do not smoke.  Women, particularly those over the age of 27, should consider the risks and benefits of taking the contraceptive pill. This kind of hormone treatment can increase your risk for blood clots.  Follow up with your health care provider as directed. SEEK  MEDICAL CARE IF:   You have unusual bruising or any bleeding problems.  Your swelling or pain in the affected area is not improving.  You are on anti-inflammatory medicine, and you develop belly (abdominal) pain. SEEK IMMEDIATE MEDICAL CARE IF:   You have a sudden onset of chest pain or difficulty breathing.  You have a fever or persistent symptoms for more than 2 3  days.  You have a fever and your symptoms suddenly get worse. MAKE SURE YOU:  Understand these instructions.  Will watch your condition.  Will get help right away if you are not doing well or get worse. Document Released: 05/07/2001 Document Revised: 03/03/2013 Document Reviewed: 01/18/2013 [facility] Patient Information 2014 [place], [place].

## 2013-10-19 NOTE — ED Provider Notes (Signed)
CSN: [id number]     Arrival date & time 10/19/13  [id number] History  This chart was scribed for non-physician practitioner, Jaynie Crumbleatyana Xia Stohr, PA-C working with Shon Batonourtney F Horton, MD by Greggory StallionKayla Andersen, ED scribe. This patient was seen in room TR05C/TR05C and the patient's care was started at 9:19 AM.   Chief Complaint  Patient presents with  . Leg Pain   The history is provided by the patient. No language interpreter was used.   HPI Comments: [name] is a 28 y.o. female who presents to the Emergency Department complaining of gradual onset, worsening, aching right knee pain that started 4 days ago. Pt had right foot swelling but states it has resolved. States she was seen for the same 2 days ago and given ibuprofen and robaxin with little relief. She states the pain worsened last night is now radiating up her leg and into her hip. Denies specific injury but states she was doing a lot of bending and heavy lifting a few days ago. Bearing weight worsens the pain. Denies leg swelling. Denies history of blood clots.    Past Medical History  Diagnosis Date  . No pertinent past medical history   . Anxiety   . Chlamydia   . Trichomonas   . Herpes    Past Surgical History  Procedure Laterality Date  . Cesarean section    . Wisdom tooth extraction     Family History  Problem Relation Age of Onset  . Adopted: Yes  . Other Neg Hx    History  Substance Use Topics  . Smoking status: Current Every Day Smoker -- 0.25 packs/day    Types: Cigarettes  . Smokeless tobacco: Never Used  . Alcohol Use: No   OB History   Grav Para Term Preterm Abortions TAB SAB Ect Mult Living   2 1 1  1  1   1      Review of Systems  Cardiovascular: Negative for leg swelling.  Musculoskeletal: Positive for arthralgias and myalgias.  Neurological: Negative for numbness.  All other systems reviewed and are negative.  Allergies  Amoxicillin; Amoxapine and related; Clindamycin/lincomycin; and  Metronidazole  Home Medications   Prior to Admission medications   Medication Sig Start Date End Date Taking? Authorizing Provider  ibuprofen (ADVIL,MOTRIN) 800 MG tablet Take 1 tablet (800 mg total) by mouth 3 (three) times daily. 10/17/13   Fayrene HelperBowie Tran, PA-C  loratadine (CLARITIN) 10 MG tablet Take 10 mg by mouth daily as needed for allergies.    Historical Provider, MD  methocarbamol (ROBAXIN) 500 MG tablet Take 1 tablet (500 mg total) by mouth 2 (two) times daily. 10/17/13   Fayrene HelperBowie Tran, PA-C   BP 112/82  Pulse 86  Temp(Src) 98.9 F (37.2 C) (Oral)  Resp 18  SpO2 100%  LMP 08/25/2013  Physical Exam  Nursing note and vitals reviewed. Constitutional: She is oriented to person, place, and time. She appears well-developed and well-nourished. No distress.  HENT:  Head: Normocephalic and atraumatic.  Eyes: EOM are normal.  Neck: Neck supple. No tracheal deviation present.  Cardiovascular: Normal rate.   Pulmonary/Chest: Effort normal. No respiratory distress.  Musculoskeletal: Normal range of motion.  Normal appearing right knee, thigh, lower leg. Range of motion of the knee joint. Pain with full flexion and extension. Negative anterior posterior drawer signs. No laxity or pain with medial lateral stress. Patient does have right thigh tenderness over medial aspect as well as right calf tenderness. Positive Homans sign. Dorsal pedal pulses intact.  Neurological: She is alert and oriented to person, place, and time.  Skin: Skin is warm and dry.  Psychiatric: She has a normal mood and affect. Her behavior is normal.   ED Course  Procedures (including critical care time)  DIAGNOSTIC STUDIES: Oxygen Saturation is 100% on RA, normal by my interpretation.    COORDINATION OF CARE: 9:23 AM-Discussed treatment plan which includes ultrasound and xray with pt at bedside and pt agreed to plan.   Labs Review Labs Reviewed - No data to display  Imaging Review Dg Knee Complete 4 Views  Right  10/19/2013   CLINICAL DATA:  Leg pain  EXAM: RIGHT KNEE - COMPLETE 4+ VIEW  COMPARISON:  None.  FINDINGS: There is no evidence of fracture, dislocation, or joint effusion. There is no evidence of arthropathy or other focal bone abnormality. Soft tissues are unremarkable.  IMPRESSION: Negative.   Electronically Signed   By: Marlan Palau M.D.   On: 10/19/2013 09:55     EKG Interpretation None      MDM   Final diagnoses:  Leg pain, right  Superficial thrombophlebitis of right leg    Patient with right leg pain, mainly in her knee. Reports swelling, however I am unable to appreciate any. Patient is ambulatory. Full ROM of the knee, doubt infection. Neurovascularly intact. Will get an x-ray of the knee, venous Doppler to rule out DVT given some calf tenderness and positive Homans sign.   Venous Doppler is negative for DVT but showed superficial thrombus in the long saphenous vein. x-rays negative. Home with Naprosyn, Ultram for pain, aspirin. Followup with a primary care Dr.  Ceasar Mons Vitals:   10/19/13 0855 10/19/13 1151  BP: 112/82 113/68  Pulse: 86 63  Temp: 98.9 F (37.2 C)   TempSrc: Oral   Resp: 18 16  SpO2: 100% 100%     I personally performed the services described in this documentation, which was scribed in my presence. The recorded information has been reviewed and is accurate.  Lottie Mussel, PA-C 10/19/13 1804

## 2013-10-19 NOTE — Progress Notes (Signed)
VASCULAR LAB PRELIMINARY  PRELIMINARY  PRELIMINARY  PRELIMINARY  Right lower extremity venous duplex completed.    Preliminary report:  Right:Superficial thrombosis noted in the LSV.  No evidence of DVT.     Gara Kroner, RVT 10/19/2013, 10:57 AM

## 2013-10-22 NOTE — Discharge Planning (Signed)
[facility] was not able to see patient, [facility] orange card information and resource guide will be sent to the address listed. ° °

## 2013-11-13 ENCOUNTER — Encounter (HOSPITAL_COMMUNITY): Payer: Self-pay | Admitting: Emergency Medicine

## 2013-11-13 ENCOUNTER — Emergency Department (HOSPITAL_COMMUNITY)
Admission: EM | Admit: 2013-11-13 | Discharge: 2013-11-13 | Disposition: A | Discharge status: Home / Self Care | Payer: 59 | Attending: Emergency Medicine | Admitting: Emergency Medicine

## 2013-11-13 DIAGNOSIS — Z880 Allergy status to penicillin: Secondary | ICD-10-CM | POA: Insufficient documentation

## 2013-11-13 DIAGNOSIS — Z79899 Other long term (current) drug therapy: Secondary | ICD-10-CM | POA: Insufficient documentation

## 2013-11-13 DIAGNOSIS — Z3202 Encounter for pregnancy test, result negative: Secondary | ICD-10-CM | POA: Insufficient documentation

## 2013-11-13 DIAGNOSIS — Z7982 Long term (current) use of aspirin: Secondary | ICD-10-CM | POA: Insufficient documentation

## 2013-11-13 DIAGNOSIS — Z8619 Personal history of other infectious and parasitic diseases: Secondary | ICD-10-CM | POA: Insufficient documentation

## 2013-11-13 DIAGNOSIS — R112 Nausea with vomiting, unspecified: Secondary | ICD-10-CM | POA: Insufficient documentation

## 2013-11-13 DIAGNOSIS — R1084 Generalized abdominal pain: Secondary | ICD-10-CM

## 2013-11-13 DIAGNOSIS — F17200 Nicotine dependence, unspecified, uncomplicated: Secondary | ICD-10-CM | POA: Insufficient documentation

## 2013-11-13 DIAGNOSIS — Z8659 Personal history of other mental and behavioral disorders: Secondary | ICD-10-CM | POA: Insufficient documentation

## 2013-11-13 LAB — URINALYSIS, ROUTINE W REFLEX MICROSCOPIC
Bilirubin Urine: NEGATIVE
GLUCOSE, UA: NEGATIVE mg/dL
Hgb urine dipstick: NEGATIVE
Ketones, ur: NEGATIVE mg/dL
LEUKOCYTES UA: NEGATIVE
Nitrite: NEGATIVE
PH: 7.5 (ref 5.0–8.0)
Protein, ur: NEGATIVE mg/dL
Specific Gravity, Urine: 1.017 (ref 1.005–1.030)
Urobilinogen, UA: 0.2 mg/dL (ref 0.0–1.0)

## 2013-11-13 LAB — CBC WITH DIFFERENTIAL/PLATELET
BASOS ABS: 0 10*3/uL (ref 0.0–0.1)
BASOS PCT: 0 % (ref 0–1)
EOS ABS: 0.2 10*3/uL (ref 0.0–0.7)
Eosinophils Relative: 2 % (ref 0–5)
HCT: 38.4 % (ref 36.0–46.0)
HEMOGLOBIN: 13.2 g/dL (ref 12.0–15.0)
Lymphocytes Relative: 46 % (ref 12–46)
Lymphs Abs: 4.2 10*3/uL — ABNORMAL HIGH (ref 0.7–4.0)
MCH: 31.7 pg (ref 26.0–34.0)
MCHC: 34.4 g/dL (ref 30.0–36.0)
MCV: 92.3 fL (ref 78.0–100.0)
MONO ABS: 0.4 10*3/uL (ref 0.1–1.0)
Monocytes Relative: 4 % (ref 3–12)
NEUTROS ABS: 4.4 10*3/uL (ref 1.7–7.7)
Neutrophils Relative %: 48 % (ref 43–77)
Platelets: 246 10*3/uL (ref 150–400)
RBC: 4.16 MIL/uL (ref 3.87–5.11)
RDW: 12.3 % (ref 11.5–15.5)
WBC: 9.1 10*3/uL (ref 4.0–10.5)

## 2013-11-13 LAB — COMPREHENSIVE METABOLIC PANEL
ALBUMIN: 3.9 g/dL (ref 3.5–5.2)
ALT: 9 U/L (ref 0–35)
AST: 11 U/L (ref 0–37)
Alkaline Phosphatase: 46 U/L (ref 39–117)
BUN: 9 mg/dL (ref 6–23)
CO2: 24 mEq/L (ref 19–32)
Calcium: 9.3 mg/dL (ref 8.4–10.5)
Chloride: 102 mEq/L (ref 96–112)
Creatinine, Ser: 0.84 mg/dL (ref 0.50–1.10)
GFR calc Af Amer: >90 mL/min (ref >90)
GFR calc non Af Amer: >90 mL/min (ref >90)
Glucose, Bld: 87 mg/dL (ref 70–99)
Potassium: 4.5 mEq/L (ref 3.7–5.3)
Sodium: 139 mEq/L (ref 137–147)
TOTAL PROTEIN: 6.7 g/dL (ref 6.0–8.3)
Total Bilirubin: 0.2 mg/dL — ABNORMAL LOW (ref 0.3–1.2)

## 2013-11-13 LAB — HCG, SERUM, QUALITATIVE: Preg, Serum: NEGATIVE

## 2013-11-13 LAB — LIPASE, BLOOD: Lipase: 17 U/L (ref 11–59)

## 2013-11-13 MED ORDER — ONDANSETRON HCL 4 MG/2ML IJ SOLN
4.0000 mg | Freq: Once | INTRAMUSCULAR | Status: AC
  Administered 2013-11-13: 4 mg via INTRAVENOUS
  Filled 2013-11-13: qty 2

## 2013-11-13 MED ORDER — MORPHINE SULFATE 4 MG/ML IJ SOLN
4.0000 mg | INTRAMUSCULAR | Status: DC | PRN
  Administered 2013-11-13: 4 mg via INTRAVENOUS
  Filled 2013-11-13: qty 1

## 2013-11-13 MED ORDER — MORPHINE SULFATE 4 MG/ML IJ SOLN
4.0000 mg | Freq: Once | INTRAMUSCULAR | Status: AC
  Administered 2013-11-13: 4 mg via INTRAVENOUS
  Filled 2013-11-13: qty 1

## 2013-11-13 MED ORDER — ONDANSETRON 4 MG PO TBDP: 4.0000 mg | ORAL_TABLET | Freq: Three times a day (TID) | ORAL | Status: DC | PRN

## 2013-11-13 MED ORDER — DICYCLOMINE HCL 20 MG PO TABS: 20.0000 mg | ORAL_TABLET | Freq: Two times a day (BID) | ORAL | Status: DC

## 2013-11-13 MED ORDER — KETOROLAC TROMETHAMINE 30 MG/ML IJ SOLN
30.0000 mg | Freq: Once | INTRAMUSCULAR | Status: AC
  Administered 2013-11-13: 30 mg via INTRAVENOUS
  Filled 2013-11-13: qty 1

## 2013-11-13 NOTE — ED Provider Notes (Signed)
CSN: [id number]     Arrival date & time 11/13/13  1149 History   First MD Initiated Contact with Patient 11/13/13 1157     Chief Complaint  Patient presents with  . Abdominal Pain  . Emesis      HPI  Patient presents with left mid abdominal pain. This has been for 2 weeks. Nausea for the last 5-6 days. Occasionally when she eats she feels like the food "just his right here" points to her lower chest. She's not had frank emesis until yesterday where she had 1 episode. No other regurgitation or drooling. Left mid abdominal pain radiating somewhat to the back. Not clear to the flank. No lower abdominal tenderness no right upper right lower abdominal pain or tenderness some episodes without formal evaluation over the last few months. No history of pyelonephritis kidney stones. She is adopted and does not know her family history. No personal history of sickle cell hemoglobinopathies.  Past Medical History  Diagnosis Date  . No pertinent past medical history   . Anxiety   . Chlamydia   . Trichomonas   . Herpes    Past Surgical History  Procedure Laterality Date  . Cesarean section    . Wisdom tooth extraction     Family History  Problem Relation Age of Onset  . Adopted: Yes  . Other Neg Hx    History  Substance Use Topics  . Smoking status: Current Every Day Smoker -- 0.25 packs/day    Types: Cigarettes  . Smokeless tobacco: Never Used  . Alcohol Use: No   OB History   Grav Para Term Preterm Abortions TAB SAB Ect Mult Living   2 1 1  1  1   1      Review of Systems  Constitutional: Negative for fever, chills, diaphoresis, appetite change and fatigue.  HENT: Negative for mouth sores, sore throat and trouble swallowing.   Eyes: Negative for visual disturbance.  Respiratory: Negative for cough, chest tightness, shortness of breath and wheezing.   Cardiovascular: Negative for chest pain.  Gastrointestinal: Positive for nausea, vomiting and abdominal pain. Negative for diarrhea  and abdominal distention.  Endocrine: Negative for polydipsia, polyphagia and polyuria.  Genitourinary: Positive for flank pain. Negative for dysuria, frequency and hematuria.  Musculoskeletal: Negative for gait problem.  Skin: Negative for color change, pallor and rash.  Neurological: Negative for dizziness, syncope, light-headedness and headaches.  Hematological: Does not bruise/bleed easily.  Psychiatric/Behavioral: Negative for behavioral problems and confusion.      Allergies  Amoxicillin; Amoxapine and related; Clindamycin/lincomycin; and Metronidazole  Home Medications   Prior to Admission medications   Medication Sig Start Date End Date Taking? Authorizing Seann Genther  aspirin EC 81 MG tablet Take 162 mg by mouth daily.   Yes Historical Damier Disano, MD  dicyclomine (BENTYL) 20 MG tablet Take 1 tablet (20 mg total) by mouth 2 (two) times daily. 11/13/13   Rolland PorterMark James, MD  ondansetron (ZOFRAN ODT) 4 MG disintegrating tablet Take 1 tablet (4 mg total) by mouth every 8 (eight) hours as needed for nausea. 11/13/13   Rolland PorterMark James, MD   BP 93/52  Pulse 70  Temp(Src) 98.2 F (36.8 C) (Oral)  Resp 16  SpO2 100%  LMP 10/23/2013 Physical Exam  Constitutional: She is oriented to person, place, and time. She appears well-developed and well-nourished. No distress.  HENT:  Head: Normocephalic.  Eyes: Conjunctivae are normal. Pupils are equal, round, and reactive to light. No scleral icterus.  Neck: Normal range of  motion. Neck supple. No thyromegaly present.  Cardiovascular: Normal rate and regular rhythm.  Exam reveals no gallop and no friction rub.   No murmur heard. Pulmonary/Chest: Effort normal and breath sounds normal. No respiratory distress. She has no wheezes. She has no rales.  Abdominal: Soft. Bowel sounds are normal. She exhibits no distension. There is no tenderness. There is no rebound.    Musculoskeletal: Normal range of motion.  Neurological: She is alert and oriented to  person, place, and time.  Skin: Skin is warm and dry. No rash noted.  Psychiatric: She has a normal mood and affect. Her behavior is normal.    ED Course  Procedures (including critical care time) Labs Review Labs Reviewed  CBC WITH DIFFERENTIAL - Abnormal; Notable for the following:    Lymphs Abs 4.2 (*)    All other components within normal limits  COMPREHENSIVE METABOLIC PANEL - Abnormal; Notable for the following:    Total Bilirubin 0.2 (*)    All other components within normal limits  LIPASE, BLOOD  URINALYSIS, ROUTINE W REFLEX MICROSCOPIC  HCG, SERUM, QUALITATIVE    Imaging Review No results found.   EKG Interpretation None      MDM   Final diagnoses:  Generalized abdominal pain    Studies are reassuring. Abdomen remains benign. Plan is discharged home. Bentyl, Zofran, recheck any evolving symptoms.    Rolland PorterMark James, MD 11/13/13 [phone]

## 2013-11-13 NOTE — ED Notes (Signed)
Pt from home c/o LUQ pain x 2weeks with nausea and emesis x1 weeks. Pt states that she feels that she has "food stuck in her throat". Pt actively vomiting at this time. Dr. Fayrene FearingJames at bedside. Pt denies urinary s/sx. Pt is A&O and in NAD

## 2013-11-13 NOTE — Discharge Instructions (Signed)
Abdominal Pain, Women °Abdominal (stomach, pelvic, or belly) pain can be caused by many things. It is important to tell your doctor: °· The location of the pain. °· Does it come and go or is it present all the time? °· Are there things that start the pain (eating certain foods, exercise)? °· Are there other symptoms associated with the pain (fever, nausea, vomiting, diarrhea)? °All of this is helpful to know when trying to find the cause of the pain. °CAUSES  °· Stomach: virus or bacteria infection, or ulcer. °· Intestine: appendicitis (inflamed appendix), regional ileitis (Crohn's disease), ulcerative colitis (inflamed colon), irritable bowel syndrome, diverticulitis (inflamed diverticulum of the colon), or cancer of the stomach or intestine. °· Gallbladder disease or stones in the gallbladder. °· Kidney disease, kidney stones, or infection. °· Pancreas infection or cancer. °· Fibromyalgia (pain disorder). °· Diseases of the female organs: °¨ Uterus: fibroid (non-cancerous) tumors or infection. °¨ Fallopian tubes: infection or tubal pregnancy. °¨ Ovary: cysts or tumors. °¨ Pelvic adhesions (scar tissue). °¨ Endometriosis (uterus lining tissue growing in the pelvis and on the pelvic organs). °¨ Pelvic congestion syndrome (female organs filling up with blood just before the menstrual period). °¨ Pain with the menstrual period. °¨ Pain with ovulation (producing an egg). °¨ Pain with an IUD (intrauterine device, birth control) in the uterus. °¨ Cancer of the female organs. °· Functional pain (pain not caused by a disease, may improve without treatment). °· Psychological pain. °· Depression. °DIAGNOSIS  °Your doctor will decide the seriousness of your pain by doing an examination. °· Blood tests. °· X-rays. °· Ultrasound. °· CT scan (computed tomography, special type of X-ray). °· MRI (magnetic resonance imaging). °· Cultures, for infection. °· Barium enema (dye inserted in the large intestine, to better view it with  X-rays). °· Colonoscopy (looking in intestine with a lighted tube). °· Laparoscopy (minor surgery, looking in abdomen with a lighted tube). °· Major abdominal exploratory surgery (looking in abdomen with a large incision). °TREATMENT  °The treatment will depend on the cause of the pain.  °· Many cases can be observed and treated at home. °· Over-the-counter medicines recommended by your caregiver. °· Prescription medicine. °· Antibiotics, for infection. °· Birth control pills, for painful periods or for ovulation pain. °· Hormone treatment, for endometriosis. °· Nerve blocking injections. °· Physical therapy. °· Antidepressants. °· Counseling with a psychologist or psychiatrist. °· Minor or major surgery. °HOME CARE INSTRUCTIONS  °· Do not take laxatives, unless directed by your caregiver. °· Take over-the-counter pain medicine only if ordered by your caregiver. Do not take aspirin because it can cause an upset stomach or bleeding. °· Try a clear liquid diet (broth or water) as ordered by your caregiver. Slowly move to a bland diet, as tolerated, if the pain is related to the stomach or intestine. °· Have a thermometer and take your temperature several times a day, and record it. °· Bed rest and sleep, if it helps the pain. °· Avoid sexual intercourse, if it causes pain. °· Avoid stressful situations. °· Keep your follow-up appointments and tests, as your caregiver orders. °· If the pain does not go away with medicine or surgery, you may try: °¨ Acupuncture. °¨ Relaxation exercises (yoga, meditation). °¨ Group therapy. °¨ Counseling. °SEEK MEDICAL CARE IF:  °· You notice certain foods cause stomach pain. °· Your home care treatment is not helping your pain. °· You need stronger pain medicine. °· You want your IUD removed. °· You feel faint or   lightheaded. °· You develop nausea and vomiting. °· You develop a rash. °· You are having side effects or an allergy to your medicine. °SEEK IMMEDIATE MEDICAL CARE IF:  °· Your  pain does not go away or gets worse. °· You have a fever. °· Your pain is felt only in portions of the abdomen. The right side could possibly be appendicitis. The left lower portion of the abdomen could be colitis or diverticulitis. °· You are passing blood in your stools (bright red or black tarry stools, with or without vomiting). °· You have blood in your urine. °· You develop chills, with or without a fever. °· You pass out. °MAKE SURE YOU:  °· Understand these instructions. °· Will watch your condition. °· Will get help right away if you are not doing well or get worse. °Document Released: 03/10/2007 Document Revised: 08/05/2011 Document Reviewed: 03/30/2009 °[employer]® Patient Information ©2015 [employer]. This information is not intended to replace advice given to you by your health care provider. Make sure you discuss any questions you have with your health care provider. ° °

## 2013-11-19 ENCOUNTER — Encounter: Payer: No Typology Code available for payment source | Admitting: Medical

## 2013-12-28 ENCOUNTER — Inpatient Hospital Stay (HOSPITAL_COMMUNITY)
Admission: AD | Admit: 2013-12-28 | Discharge: 2013-12-28 | Disposition: A | Discharge status: Home / Self Care | Payer: 59 | Source: Ambulatory Visit | Attending: Family Medicine | Admitting: Family Medicine

## 2013-12-28 ENCOUNTER — Encounter (HOSPITAL_COMMUNITY): Payer: Self-pay | Admitting: *Deleted

## 2013-12-28 DIAGNOSIS — Z3202 Encounter for pregnancy test, result negative: Secondary | ICD-10-CM | POA: Insufficient documentation

## 2013-12-28 DIAGNOSIS — Z113 Encounter for screening for infections with a predominantly sexual mode of transmission: Secondary | ICD-10-CM

## 2013-12-28 DIAGNOSIS — M79609 Pain in unspecified limb: Secondary | ICD-10-CM | POA: Insufficient documentation

## 2013-12-28 DIAGNOSIS — M79662 Pain in left lower leg: Secondary | ICD-10-CM

## 2013-12-28 DIAGNOSIS — Z86718 Personal history of other venous thrombosis and embolism: Secondary | ICD-10-CM | POA: Insufficient documentation

## 2013-12-28 DIAGNOSIS — F17200 Nicotine dependence, unspecified, uncomplicated: Secondary | ICD-10-CM | POA: Insufficient documentation

## 2013-12-28 LAB — URINALYSIS, ROUTINE W REFLEX MICROSCOPIC
BILIRUBIN URINE: NEGATIVE
GLUCOSE, UA: NEGATIVE mg/dL
Hgb urine dipstick: NEGATIVE
Ketones, ur: NEGATIVE mg/dL
Leukocytes, UA: NEGATIVE
NITRITE: NEGATIVE
PH: 7 (ref 5.0–8.0)
Protein, ur: NEGATIVE mg/dL
SPECIFIC GRAVITY, URINE: 1.015 (ref 1.005–1.030)
Urobilinogen, UA: 0.2 mg/dL (ref 0.0–1.0)

## 2013-12-28 LAB — WET PREP, GENITAL
Trich, Wet Prep: NONE SEEN
Yeast Wet Prep HPF POC: NONE SEEN

## 2013-12-28 LAB — POCT PREGNANCY, URINE: Preg Test, Ur: NEGATIVE

## 2013-12-28 LAB — D-DIMER, QUANTITATIVE: D-Dimer, Quant: <0.27 ug/mL-FEU (ref 0.00–0.48)

## 2013-12-28 NOTE — MAU Note (Addendum)
Pt has hx of DVT in May.

## 2013-12-28 NOTE — MAU Provider Note (Signed)
CSN: [id number]     Arrival date & time 12/28/13  1850 History   None    Chief Complaint  Patient presents with  . Leg Pain  . Nausea  . possible pregnant      (Consider location/radiation/quality/duration/timing/severity/associated sxs/prior Treatment) The history is provided by the patient.  [name] is a 28 y.o. female who presents to the ED for STI testing and pregnancy test. She states her last period was 7/24 and only lasted 3 days and was lighter than normal. She had an IUD for birth control but accidentally pulled it out while washing herself in the shower. She has unprotected sex. She has been with her current partner x 8 months.  She denies abdominal pain or vaginal discharge. She also states that she has left calf pain. States that she has a history of blood clot in right calf 09/2013.   Past Medical History  Diagnosis Date  . No pertinent past medical history   . Anxiety   . Chlamydia   . Trichomonas   . Herpes   . DVT (deep venous thrombosis)    Past Surgical History  Procedure Laterality Date  . Cesarean section    . Wisdom tooth extraction     Family History  Problem Relation Age of Onset  . Adopted: Yes  . Other Neg Hx    History  Substance Use Topics  . Smoking status: Current Some Day Smoker -- 0.25 packs/day    Types: Cigarettes  . Smokeless tobacco: Never Used  . Alcohol Use: No   OB History   Grav Para Term Preterm Abortions TAB SAB Ect Mult Living   2 1 1  1  1   1      Review of Systems Negative except as stated in HPI   Allergies  Amoxicillin; Clindamycin/lincomycin; Metronidazole; and Amoxapine and related  Home Medications   Prior to Admission medications   Medication Sig Start Date End Date Taking? Authorizing Provider  aspirin EC 81 MG tablet Take 162 mg by mouth daily.   Yes Historical Provider, MD  ondansetron (ZOFRAN ODT) 4 MG disintegrating tablet Take 1 tablet (4 mg total) by mouth every 8 (eight) hours as needed for  nausea. 11/13/13   Rolland Porter, MD   BP 111/73  Pulse 73  Temp(Src) 98.7 F (37.1 C) (Oral)  Resp 18  Ht 5\' 5"  (1.651 m)  Wt 177 lb 12.8 oz (80.65 kg)  BMI 29.59 kg/m2  LMP 12/17/2013 Physical Exam  Nursing note and vitals reviewed. Constitutional: She is oriented to person, place, and time. She appears well-developed and well-nourished.  HENT:  Head: Normocephalic.  Eyes: EOM are normal.  Neck: Neck supple.  Cardiovascular: Normal rate.   Pulmonary/Chest: Effort normal.  Abdominal: Soft.  Musculoskeletal: Normal range of motion.       Left lower leg: She exhibits tenderness. She exhibits no swelling, no deformity and no laceration.  Pedal pulses equal, adequate circulation. She does have tenderness with palpation of the left calf.   Neurological: She is alert and oriented to person, place, and time. No cranial nerve deficit.  Skin: Skin is warm and dry.  Psychiatric: She has a normal mood and affect. Her behavior is normal.   Results for orders placed during the hospital encounter of 12/28/13 (from the past 24 hour(s))  URINALYSIS, ROUTINE W REFLEX MICROSCOPIC     Status: None   Collection Time    12/28/13  7:10 PM      Result  Value Ref Range   Color, Urine YELLOW  YELLOW   APPearance CLEAR  CLEAR   Specific Gravity, Urine 1.015  1.005 - 1.030   pH 7.0  5.0 - 8.0   Glucose, UA NEGATIVE  NEGATIVE mg/dL   Hgb urine dipstick NEGATIVE  NEGATIVE   Bilirubin Urine NEGATIVE  NEGATIVE   Ketones, ur NEGATIVE  NEGATIVE mg/dL   Protein, ur NEGATIVE  NEGATIVE mg/dL   Urobilinogen, UA 0.2  0.0 - 1.0 mg/dL   Nitrite NEGATIVE  NEGATIVE   Leukocytes, UA NEGATIVE  NEGATIVE  POCT PREGNANCY, URINE     Status: None   Collection Time    12/28/13  7:16 PM      Result Value Ref Range   Preg Test, Ur NEGATIVE  NEGATIVE  WET PREP, GENITAL     Status: Abnormal   Collection Time    12/28/13  7:40 PM      Result Value Ref Range   Yeast Wet Prep HPF POC NONE SEEN  NONE SEEN   Trich, Wet Prep  NONE SEEN  NONE SEEN   Clue Cells Wet Prep HPF POC FEW (*) NONE SEEN   WBC, Wet Prep HPF POC FEW (*) NONE SEEN  D-DIMER, QUANTITATIVE     Status: None   Collection Time    12/28/13  7:55 PM      Result Value Ref Range   D-Dimer, Quant <0.27  0.00 - 0.48 ug/mL-FEU    ED Course  Procedures  On review of the patient's previous ED visits there is no documentation of DVT in May. There was an ER visit for knee pain and patient was treated and discharged. She returned a few days later and had x-rays of the right knee were normal. There was a doppler study done that showed a superficial clot but no DVT. The patient was instructed to take an ASA each day.   The patient does not want to [facility]. She states that she will drive to [facility] for treatment of her leg pain or make an appointment with her doctor.  MDM  I discussed this case with Dr. Shawnie PonsPratt.  28 y.o. female here for STI screening and pregnancy test. Pregnancy test negative, Cultures for GC and Chlamydia pending, HIV and RPR pending. Patient with left calf pain and D-Dimer <0.27. Discussed need for follow up and patient voices understanding. Stable for discharge with steady gait and no limp. Will call if cultures area positive.

## 2013-12-28 NOTE — MAU Note (Signed)
L calf pain since Friday, tender to touch, no edema noted.  LMP 7/24, lighter than usual, only lasted 3 days.  Wants to be tested for STD's, having unprotected sex.  Having some nausea, no vomiting.

## 2013-12-28 NOTE — Discharge Instructions (Signed)
Your blood work tonight does not show an elevation in the test we do for blood clots. However, you should have further evaluation for the pain. Since you do not want to go to [facility] for a doppler study you should follow up with the doctor that you are comfortable with and trust. The cultures we sent will not be back for 24 hours. We will only call if they are positive.

## 2013-12-29 LAB — GC/CHLAMYDIA PROBE AMP
CT PROBE, AMP APTIMA: NEGATIVE
GC PROBE AMP APTIMA: NEGATIVE

## 2013-12-29 LAB — RPR

## 2013-12-29 LAB — HIV ANTIBODY (ROUTINE TESTING W REFLEX): HIV 1&2 Ab, 4th Generation: NONREACTIVE

## 2013-12-30 NOTE — MAU Provider Note (Signed)
Attestation of Attending Supervision of Advanced Practitioner (PA/CNM/NP): Evaluation and management procedures were performed by the Advanced Practitioner under my supervision and collaboration.  I have reviewed the Advanced Practitioner's note and chart, and I agree with the management and plan.  Lynell Greenhouse S, MD [facility] Attending 12/30/2013 8:37 AM   

## 2014-03-28 ENCOUNTER — Encounter (HOSPITAL_COMMUNITY): Payer: Self-pay | Admitting: *Deleted

## 2014-05-12 ENCOUNTER — Ambulatory Visit (INDEPENDENT_AMBULATORY_CARE_PROVIDER_SITE_OTHER): Payer: Self-pay | Admitting: Physician Assistant

## 2014-05-12 VITALS — BP 110/72 | HR 70 | Temp 98.5°F | Resp 18 | Ht 67.0 in | Wt 161.0 lb

## 2014-05-12 DIAGNOSIS — N898 Other specified noninflammatory disorders of vagina: Secondary | ICD-10-CM

## 2014-05-12 DIAGNOSIS — R1115 Cyclical vomiting syndrome unrelated to migraine: Secondary | ICD-10-CM

## 2014-05-12 DIAGNOSIS — G43A0 Cyclical vomiting, not intractable: Secondary | ICD-10-CM

## 2014-05-12 LAB — POCT CBC
Granulocyte percent: 49.8 %G (ref 37–80)
HCT, POC: 39.2 % (ref 37.7–47.9)
Hemoglobin: 12.9 g/dL (ref 12.2–16.2)
LYMPH, POC: 4.6 — AB (ref 0.6–3.4)
MCH: 31 pg (ref 27–31.2)
MCHC: 33 g/dL (ref 31.8–35.4)
MCV: 94 fL (ref 80–97)
MID (CBC): 0.4 (ref 0–0.9)
MPV: 6.9 fL (ref 0–99.8)
PLATELET COUNT, POC: 277 10*3/uL (ref 142–424)
POC Granulocyte: 5 (ref 2–6.9)
POC LYMPH PERCENT: 46 %L (ref 10–50)
POC MID %: 4.2 %M (ref 0–12)
RBC: 4.16 M/uL (ref 4.04–5.48)
RDW, POC: 13.7 %
WBC: 10.1 10*3/uL (ref 4.6–10.2)

## 2014-05-12 LAB — POCT WET PREP WITH KOH
Clue Cells Wet Prep HPF POC: NEGATIVE
KOH Prep POC: NEGATIVE
Trichomonas, UA: NEGATIVE
Yeast Wet Prep HPF POC: NEGATIVE

## 2014-05-12 LAB — POCT URINE PREGNANCY: Preg Test, Ur: NEGATIVE

## 2014-05-12 MED ORDER — ONDANSETRON 4 MG PO TBDP
8.0000 mg | ORAL_TABLET | Freq: Once | ORAL | Status: AC
  Administered 2014-05-12: 8 mg via ORAL

## 2014-05-12 MED ORDER — ONDANSETRON HCL 4 MG PO TABS: 4.0000 mg | ORAL_TABLET | Freq: Three times a day (TID) | ORAL | Status: DC | PRN

## 2014-05-12 NOTE — Progress Notes (Signed)
IDENTIFYING INFORMATION  [name] / DOB: 11/26/1985 / MRN: [id number]  The patient  does not have a problem list on file.  SUBJECTIVE  CC: Emesis   HPI: [name] is a 28 y.o. y.o. female presenting with 8 days of nausea and emesis. She [facility] to [facility] on the 13th of December and was told that she had a viral illness. She was prescribed Zofran but didn't fill it as she was told that it would make her sleepy.    Today she reports continued nausea and emesis. She complains of vomiting blood x 2 earlier today, but has vomited a few times since and has had no blood.  She denies blood in the stool, and has been having normal bowel movements and frequency.  She feels it possible that she may be pregnant.  LMP was 3 weeks previous, and she reports similar symptoms that started and resolved roughly 2 weeks ago. She denies GERD like symptoms along with nighttime cough.   She complains of vaginal discharge that started on Sunday, and reports that it is thick, white, and malodorous.  She is in a relationship with her boyfriend of one year and she says that she is monogamous, but is not sure if he is.     She  has a past medical history of No pertinent past medical history; Anxiety; Chlamydia; Trichomonas; Herpes; DVT (deep venous thrombosis); and Slipped cervical disc.    She has a current medication list which includes the following prescription(s): aspirin ec and ondansetron.  [name] is allergic to amoxicillin; clindamycin/lincomycin; metronidazole; and amoxapine and related. She  reports that she has been smoking Cigarettes.  She has a 15 pack-year smoking history. She has never used smokeless tobacco. She reports that she does not drink alcohol or use illicit drugs. She  reports that she currently engages in sexual activity. She reports using the following methods of birth control/protection: IUD and None.  The patient  has past surgical history that includes Cesarean section and  Wisdom tooth extraction.  Her family history is negative for Other. She was adopted.  Review of Systems  Constitutional: Negative.   Respiratory: Negative for cough and wheezing.   Cardiovascular: Negative for chest pain.  Gastrointestinal: Positive for nausea, vomiting and abdominal pain.  Genitourinary: Negative.   Musculoskeletal: Negative.   Skin: Negative.   Neurological: Negative for dizziness and headaches.    OBJECTIVE  Blood pressure 110/72, pulse 70, temperature 98.5 F (36.9 C), temperature source Oral, resp. rate 18, height 5\' 7"  (1.702 m), weight 161 lb (73.029 kg), last menstrual period 04/22/2014, SpO2 100 %. The patient's body mass index is 25.21 kg/(m^2).  Physical Exam  Constitutional: She is oriented to person, place, and time. She appears well-developed and well-nourished. No distress.  Neck: Normal range of motion. Neck supple.  Cardiovascular: Normal rate and regular rhythm.   Respiratory: Effort normal and breath sounds normal.  GI: Soft. Bowel sounds are normal. She exhibits no distension and no mass. There is no tenderness. There is no rebound and no guarding.  Musculoskeletal: Normal range of motion.  Neurological: She is alert and oriented to person, place, and time. No cranial nerve deficit.  Skin: Skin is warm and dry. She is not diaphoretic.  Psychiatric: She has a normal mood and affect. Her behavior is normal. Judgment and thought content normal.    Results for orders placed or performed in visit on 05/12/14 (from the past 24 hour(s))  POCT urine pregnancy  Status: None   Collection Time: 05/12/14  4:30 PM  Result Value Ref Range   Preg Test, Ur Negative   POCT CBC     Status: Abnormal   Collection Time: 05/12/14  4:30 PM  Result Value Ref Range   WBC 10.1 4.6 - 10.2 K/uL   Lymph, poc 4.6 (A) 0.6 - 3.4   POC LYMPH PERCENT 46.0 10 - 50 %L   MID (cbc) 0.4 0 - 0.9   POC MID % 4.2 0 - 12 %M   POC Granulocyte 5.0 2 - 6.9   Granulocyte percent  49.8 37 - 80 %G   RBC 4.16 4.04 - 5.48 M/uL   Hemoglobin 12.9 12.2 - 16.2 g/dL   HCT, POC 16.139.2 09.637.7 - 47.9 %   MCV 94.0 80 - 97 fL   MCH, POC 31.0 27 - 31.2 pg   MCHC 33.0 31.8 - 35.4 g/dL   RDW, POC 04.513.7 %   Platelet Count, POC 277 142 - 424 K/uL   MPV 6.9 0 - 99.8 fL  POCT Wet Prep with KOH     Status: None   Collection Time: 05/12/14  5:02 PM  Result Value Ref Range   Trichomonas, UA Negative    Clue Cells Wet Prep HPF POC neg    Epithelial Wet Prep HPF POC 4-8    Yeast Wet Prep HPF POC neg    Bacteria Wet Prep HPF POC 1+    RBC Wet Prep HPF POC 0-1    WBC Wet Prep HPF POC 0-2    KOH Prep POC Negative     ASSESSMENT & PLAN  [name] was seen today for emesis.  Diagnoses and associated orders for this visit:  Non-intractable cyclical vomiting with nausea: Unkown etiology at this point.  Abdominal exam reassuring and labs WNL.  GERD component unlikely given lack of symtpoms, however, discussed with patient that a trial of OTC ranitidine would be worth trying given that the benefit of this medication far outweighs the risks.   - POCT urine pregnancy - POCT CBC - ondansetron (ZOFRAN-ODT) disintegrating tablet 8 mg; Take 2 tablets (8 mg total) by mouth once.  Vaginal discharge - POCT Wet Prep with KOH - GC/Chlamydia Probe Amp     The patient was instructed to to call or comeback to clinic as needed, or should symptoms warrant.  Deliah BostonMichael Johneisha Broaden, MHS, PA-C [facility] 05/12/2014 5:03 PM

## 2014-05-13 LAB — GC/CHLAMYDIA PROBE AMP
CT PROBE, AMP APTIMA: NEGATIVE
GC Probe RNA: NEGATIVE

## 2014-09-14 ENCOUNTER — Encounter (HOSPITAL_COMMUNITY): Payer: Self-pay | Admitting: *Deleted

## 2014-09-14 ENCOUNTER — Inpatient Hospital Stay (HOSPITAL_COMMUNITY)
Admission: AD | Admit: 2014-09-14 | Discharge: 2014-09-14 | Disposition: A | Discharge status: Home / Self Care | Payer: Self-pay | Source: Ambulatory Visit | Attending: Obstetrics & Gynecology | Admitting: Obstetrics & Gynecology

## 2014-09-14 DIAGNOSIS — R109 Unspecified abdominal pain: Secondary | ICD-10-CM | POA: Insufficient documentation

## 2014-09-14 DIAGNOSIS — D649 Anemia, unspecified: Secondary | ICD-10-CM | POA: Insufficient documentation

## 2014-09-14 DIAGNOSIS — F17210 Nicotine dependence, cigarettes, uncomplicated: Secondary | ICD-10-CM | POA: Insufficient documentation

## 2014-09-14 DIAGNOSIS — N926 Irregular menstruation, unspecified: Secondary | ICD-10-CM

## 2014-09-14 LAB — CBC
HEMATOCRIT: 35.3 % — AB (ref 36.0–46.0)
HEMOGLOBIN: 11.9 g/dL — AB (ref 12.0–15.0)
MCH: 31.8 pg (ref 26.0–34.0)
MCHC: 33.7 g/dL (ref 30.0–36.0)
MCV: 94.4 fL (ref 78.0–100.0)
Platelets: 217 10*3/uL (ref 150–400)
RBC: 3.74 MIL/uL — AB (ref 3.87–5.11)
RDW: 12.5 % (ref 11.5–15.5)
WBC: 7 10*3/uL (ref 4.0–10.5)

## 2014-09-14 LAB — URINALYSIS, ROUTINE W REFLEX MICROSCOPIC
BILIRUBIN URINE: NEGATIVE
GLUCOSE, UA: NEGATIVE mg/dL
KETONES UR: NEGATIVE mg/dL
Nitrite: NEGATIVE
PH: 7.5 (ref 5.0–8.0)
Protein, ur: NEGATIVE mg/dL
Specific Gravity, Urine: 1.025 (ref 1.005–1.030)
Urobilinogen, UA: 0.2 mg/dL (ref 0.0–1.0)

## 2014-09-14 LAB — URINE MICROSCOPIC-ADD ON

## 2014-09-14 LAB — WET PREP, GENITAL
TRICH WET PREP: NONE SEEN
YEAST WET PREP: NONE SEEN

## 2014-09-14 LAB — POCT PREGNANCY, URINE: Preg Test, Ur: NEGATIVE

## 2014-09-14 NOTE — MAU Provider Note (Signed)
History     CSN: [id number]  Arrival date and time: 09/14/14 1036   First Provider Initiated Contact with Patient 09/14/14 1137      Chief Complaint  Patient presents with  . Vaginal Bleeding  . Dizziness  . Abdominal Cramping   HPI   Ms. [name] is a 29 y.o. female G2P1011 who presents with vaginal bleeding; her last menstrual cycle was the end of March.  This current cycle started on 4/18; The bleeding started as brown, and is now heavy. She is changing her tampon/ pad every 3 hours. She does not normally have heavy cycles, this is the first time she has ever had a period this heavy. She is also experiencing lower abdominal cramping that she rates 4/10.   The other concern she is having is dizziness. She has had dizziness almost daily for a few weeks. The dizziness is not associated with anything; it comes randomly and seems worse when she goes from the sitting to standing position. She feels that she is drinking a sufficient amount of liquids throughout the day. The dizziness started prior to her heavy vaginal bleeding episode. She has not passed out or fallen related to the dizziness.   OB History    Gravida Para Term Preterm AB TAB SAB Ectopic Multiple Living   2 1 1  1  1   1       Past Medical History  Diagnosis Date  . No pertinent past medical history   . Anxiety   . Chlamydia   . Trichomonas   . Herpes   . DVT (deep venous thrombosis)   . Slipped cervical disc     Past Surgical History  Procedure Laterality Date  . Cesarean section    . Wisdom tooth extraction      Family History  Problem Relation Age of Onset  . Adopted: Yes  . Other Neg Hx     History  Substance Use Topics  . Smoking status: Current Some Day Smoker -- 1.50 packs/day for 10 years    Types: Cigarettes  . Smokeless tobacco: Never Used  . Alcohol Use: No    Allergies:  Allergies  Allergen Reactions  . Amoxicillin Other (See Comments)    Skin peeling  .  Clindamycin/Lincomycin Other (See Comments)    Skin peels on perineum, lower pubic area, and groin. Same reaction as  amoxicillin  . Metronidazole Other (See Comments)    Skin peels  . Amoxapine And Related Other (See Comments)    Causes skin to peel.    Prescriptions prior to admission  Medication Sig Dispense Refill Last Dose  . ondansetron (ZOFRAN ODT) 4 MG disintegrating tablet Take 1 tablet (4 mg total) by mouth every 8 (eight) hours as needed for nausea. (Patient not taking: Reported on 05/12/2014) 6 tablet 0 Not Taking  . ondansetron (ZOFRAN) 4 MG tablet Take 1 tablet (4 mg total) by mouth every 8 (eight) hours as needed for nausea or vomiting. (Patient not taking: Reported on 09/14/2014) 20 tablet 0    Results for orders placed or performed during the hospital encounter of 09/14/14 (from the past 48 hour(s))  Urinalysis, Routine w reflex microscopic     Status: Abnormal   Collection Time: 09/14/14 10:50 AM  Result Value Ref Range   Color, Urine YELLOW YELLOW   APPearance CLEAR CLEAR   Specific Gravity, Urine 1.025 1.005 - 1.030   pH 7.5 5.0 - 8.0   Glucose, UA NEGATIVE NEGATIVE mg/dL  Hgb urine dipstick MODERATE (A) NEGATIVE   Bilirubin Urine NEGATIVE NEGATIVE   Ketones, ur NEGATIVE NEGATIVE mg/dL   Protein, ur NEGATIVE NEGATIVE mg/dL   Urobilinogen, UA 0.2 0.0 - 1.0 mg/dL   Nitrite NEGATIVE NEGATIVE   Leukocytes, UA SMALL (A) NEGATIVE  Urine microscopic-add on     Status: Abnormal   Collection Time: 09/14/14 10:50 AM  Result Value Ref Range   Squamous Epithelial / LPF FEW (A) RARE   RBC / HPF 3-6 <3 RBC/hpf   Bacteria, UA RARE RARE  Pregnancy, urine POC     Status: None   Collection Time: 09/14/14 11:11 AM  Result Value Ref Range   Preg Test, Ur NEGATIVE NEGATIVE    Comment:        THE SENSITIVITY OF THIS METHODOLOGY IS >24 mIU/mL   CBC     Status: Abnormal   Collection Time: 09/14/14 11:38 AM  Result Value Ref Range   WBC 7.0 4.0 - 10.5 K/uL   RBC 3.74 (L)  3.87 - 5.11 MIL/uL   Hemoglobin 11.9 (L) 12.0 - 15.0 g/dL   HCT 16.1 (L) 09.6 - 04.5 %   MCV 94.4 78.0 - 100.0 fL   MCH 31.8 26.0 - 34.0 pg   MCHC 33.7 30.0 - 36.0 g/dL   RDW 40.9 81.1 - 91.4 %   Platelets 217 150 - 400 K/uL  Wet prep, genital     Status: Abnormal   Collection Time: 09/14/14 12:05 PM  Result Value Ref Range   Yeast Wet Prep HPF POC NONE SEEN NONE SEEN   Trich, Wet Prep NONE SEEN NONE SEEN   Clue Cells Wet Prep HPF POC FEW (A) NONE SEEN   WBC, Wet Prep HPF POC FEW (A) NONE SEEN    Comment: FEW BACTERIA SEEN    Review of Systems  Constitutional: Negative for fever and chills.  Gastrointestinal: Positive for nausea and abdominal pain. Negative for vomiting.  Neurological: Positive for dizziness.   Physical Exam   Blood pressure 120/86, pulse 81, temperature 98.6 F (37 C), temperature source Oral, resp. rate 18, height  (1.651 m), weight 74.844 kg (165 lb), last menstrual period 09/12/2014.  Physical Exam  Constitutional: She is oriented to person, place, and time. She appears well-developed and well-nourished. No distress.  HENT:  Head: Normocephalic.  Eyes: Pupils are equal, round, and reactive to light.  Neck: Neck supple.  Respiratory: Breath sounds normal.  GI: Soft. She exhibits no distension. There is no tenderness. There is no rebound.  Genitourinary:  Speculum exam: Vagina - Small amount of dark red blood in vaginal canal.  Cervix -+active bleeding; small amount. Mucus like Bimanual exam: Cervix closed, no CMT  Uterus non tender, normal size Adnexa non tender, no masses bilaterally GC/Chlam, wet prep done Chaperone present for exam.  Musculoskeletal: Normal range of motion.  Neurological: She is alert and oriented to person, place, and time.  Skin: Skin is warm. She is not diaphoretic.    MAU Course  Procedures  None  MDM  Normal orthostatic vitals   Assessment and Plan   A:  1. Abnormal menstrual periods   2. Anemia,  unspecified anemia type     P:  Discharge home in stable condition Over the counter iron as directed on the bottle 8-10 glass of water per day.  Small, frequent meals Bleeding precautions      Duane Lope, NP 09/14/2014 11:56 AM

## 2014-09-14 NOTE — Discharge Instructions (Signed)

## 2014-09-14 NOTE — MAU Note (Signed)
Period was at the end of March, only lasted 3 days. Wk after that, ? Almost passed out.  End of last wk- same thing happened twice.  2 days ago had dull back pain and sharp cramping. Has had some nauseated.  Started bleeding, dark blood after onset of pain, bleeding has continued, changing every 3hrs.

## 2014-09-15 LAB — GC/CHLAMYDIA PROBE AMP (~~LOC~~) NOT AT ARMC
Chlamydia: NEGATIVE
Neisseria Gonorrhea: NEGATIVE

## 2014-09-19 ENCOUNTER — Emergency Department (HOSPITAL_COMMUNITY)
Admission: EM | Admit: 2014-09-19 | Discharge: 2014-09-19 | Disposition: A | Discharge status: Home / Self Care | Payer: Self-pay | Attending: Emergency Medicine | Admitting: Emergency Medicine

## 2014-09-19 ENCOUNTER — Emergency Department (HOSPITAL_COMMUNITY): Payer: Self-pay

## 2014-09-19 ENCOUNTER — Encounter (HOSPITAL_COMMUNITY): Payer: Self-pay | Admitting: Emergency Medicine

## 2014-09-19 DIAGNOSIS — Z8659 Personal history of other mental and behavioral disorders: Secondary | ICD-10-CM | POA: Insufficient documentation

## 2014-09-19 DIAGNOSIS — R200 Anesthesia of skin: Secondary | ICD-10-CM | POA: Insufficient documentation

## 2014-09-19 DIAGNOSIS — Z8739 Personal history of other diseases of the musculoskeletal system and connective tissue: Secondary | ICD-10-CM | POA: Insufficient documentation

## 2014-09-19 DIAGNOSIS — Z880 Allergy status to penicillin: Secondary | ICD-10-CM | POA: Insufficient documentation

## 2014-09-19 DIAGNOSIS — Z720 Tobacco use: Secondary | ICD-10-CM | POA: Insufficient documentation

## 2014-09-19 DIAGNOSIS — Z86718 Personal history of other venous thrombosis and embolism: Secondary | ICD-10-CM | POA: Insufficient documentation

## 2014-09-19 DIAGNOSIS — J069 Acute upper respiratory infection, unspecified: Secondary | ICD-10-CM | POA: Insufficient documentation

## 2014-09-19 DIAGNOSIS — Z8619 Personal history of other infectious and parasitic diseases: Secondary | ICD-10-CM | POA: Insufficient documentation

## 2014-09-19 MED ORDER — FLUTICASONE PROPIONATE 50 MCG/ACT NA SUSP: 2.0000 | Freq: Every day | NASAL | Status: DC

## 2014-09-19 MED ORDER — SALINE SPRAY 0.65 % NA SOLN
1.0000 | Freq: Once | NASAL | Status: AC
Start: 2014-09-19 — End: 2014-09-19
  Administered 2014-09-19: 1 via NASAL
  Filled 2014-09-19: qty 44

## 2014-09-19 MED ORDER — FLUTICASONE PROPIONATE 50 MCG/ACT NA SUSP
2.0000 | Freq: Every day | NASAL | Status: DC
  Filled 2014-09-19: qty 16

## 2014-09-19 MED ORDER — SALINE SPRAY 0.65 % NA SOLN: 1.0000 | Freq: Once | NASAL | Status: DC

## 2014-09-19 NOTE — Discharge Instructions (Signed)
You presented with cough and congestion.  This is likely viral in nature. Her chest x-ray was negative. Regarding your leg numbness, this appears to be a chronic issue and may be related to your herniated disc. You need [facility] to establish primary care. You may need an MRI. If you develop weakness, difficulty with her bowel or bladder, you need to be evaluated immediately.  Upper Respiratory Infection, Adult An upper respiratory infection (URI) is also sometimes known as the common cold. The upper respiratory tract includes the nose, sinuses, throat, trachea, and bronchi. Bronchi are the airways leading to the lungs. Most people improve within 1 week, but symptoms can last up to 2 weeks. A residual cough may last even longer.  CAUSES Many different viruses can infect the tissues lining the upper respiratory tract. The tissues become irritated and inflamed and often become very moist. Mucus production is also common. A cold is contagious. You can easily spread the virus to others by oral contact. This includes kissing, sharing a glass, coughing, or sneezing. Touching your mouth or nose and then touching a surface, which is then touched by another person, can also spread the virus. SYMPTOMS  Symptoms typically develop 1 to 3 days after you come in contact with a cold virus. Symptoms vary from person to person. They may include:  Runny nose.  Sneezing.  Nasal congestion.  Sinus irritation.  Sore throat.  Loss of voice (laryngitis).  Cough.  Fatigue.  Muscle aches.  Loss of appetite.  Headache.  Low-grade fever. DIAGNOSIS  You might diagnose your own cold based on familiar symptoms, since most people get a cold 2 to 3 times a year. Your caregiver can confirm this based on your exam. Most importantly, your caregiver can check that your symptoms are not due to another disease such as strep throat, sinusitis, pneumonia, asthma, or epiglottitis. Blood tests,  throat tests, and X-rays are not necessary to diagnose a common cold, but they may sometimes be helpful in excluding other more serious diseases. Your caregiver will decide if any further tests are required. RISKS AND COMPLICATIONS  You may be at risk for a more severe case of the common cold if you smoke cigarettes, have chronic heart disease (such as heart failure) or lung disease (such as asthma), or if you have a weakened immune system. The very young and very old are also at risk for more serious infections. Bacterial sinusitis, middle ear infections, and bacterial pneumonia can complicate the common cold. The common cold can worsen asthma and chronic obstructive pulmonary disease (COPD). Sometimes, these complications can require emergency medical care and may be life-threatening. PREVENTION  The best way to protect against getting a cold is to practice good hygiene. Avoid oral or hand contact with people with cold symptoms. Wash your hands often if contact occurs. There is no clear evidence that vitamin C, vitamin E, echinacea, or exercise reduces the chance of developing a cold. However, it is always recommended to get plenty of rest and practice good nutrition. TREATMENT  Treatment is directed at relieving symptoms. There is no cure. Antibiotics are not effective, because the infection is caused by a virus, not by bacteria. Treatment may include:  Increased fluid intake. Sports drinks offer valuable electrolytes, sugars, and fluids.  Breathing heated mist or steam (vaporizer or shower).  Eating chicken soup or other clear broths, and maintaining good nutrition.  Getting plenty of rest.  Using gargles or lozenges for comfort.  Controlling fevers with ibuprofen or acetaminophen as directed by your caregiver.  Increasing usage of your inhaler if you have asthma. Zinc gel and zinc lozenges, taken in the first 24 hours of the common cold, can shorten the duration and lessen the severity of  symptoms. Pain medicines may help with fever, muscle aches, and throat pain. A variety of non-prescription medicines are available to treat congestion and runny nose. Your caregiver can make recommendations and may suggest nasal or lung inhalers for other symptoms.  HOME CARE INSTRUCTIONS   Only take over-the-counter or prescription medicines for pain, discomfort, or fever as directed by your caregiver.  Use a warm mist humidifier or inhale steam from a shower to increase air moisture. This may keep secretions moist and make it easier to breathe.  Drink enough water and fluids to keep your urine clear or pale yellow.  Rest as needed.  Return to work when your temperature has returned to normal or as your caregiver advises. You may need to stay home longer to avoid infecting others. You can also use a face mask and careful hand washing to prevent spread of the virus. SEEK MEDICAL CARE IF:   After the first few days, you feel you are getting worse rather than better.  You need your caregiver's advice about medicines to control symptoms.  You develop chills, worsening shortness of breath, or brown or red sputum. These may be signs of pneumonia.  You develop yellow or brown nasal discharge or pain in the face, especially when you bend forward. These may be signs of sinusitis.  You develop a fever, swollen neck glands, pain with swallowing, or white areas in the back of your throat. These may be signs of strep throat. SEEK IMMEDIATE MEDICAL CARE IF:   You have a fever.  You develop severe or persistent headache, ear pain, sinus pain, or chest pain.  You develop wheezing, a prolonged cough, cough up blood, or have a change in your usual mucus (if you have chronic lung disease).  You develop sore muscles or a stiff neck. Document Released: 11/06/2000 Document Revised: 08/05/2011 Document Reviewed: 08/18/2013 [facility] Patient Information 2015 [employer], [place]. This information is not intended  to replace advice given to you by your health care provider. Make sure you discuss any questions you have with your health care provider.

## 2014-09-19 NOTE — ED Provider Notes (Signed)
CSN: [id number]     Arrival date & time 09/19/14  4098 History   First MD Initiated Contact with Patient 09/19/14 0827     Chief Complaint  Patient presents with  . Cough  . leg numbness      (Consider location/radiation/quality/duration/timing/severity/associated sxs/prior Treatment) HPI  This is a 29 year old female with a history of herniated disc and DVT who presents with cough and congestion. Patient reports 2 to three-day history of cough productive of yellow phlegm, congestion, and sore throat. Reports chills without documented fevers. Denies any shortness of breath or chest pain. She is a current smoker. Son has a similar illness. Patient also reports increasing left leg numbness for several months. Reports a history of herniated disc which she is to call for back pain. She states that the back pain has improved but she has had progressive feeling of pins and needles in her left leg and numbness. It is very positional and gets aggravated when sitting too long. She does not have a primary physician. Had an MRI 2014 that showed a herniated disc on the left side.  Patient denies any lower extremity weakness, bowel or bladder incontinence.  Past Medical History  Diagnosis Date  . No pertinent past medical history   . Anxiety   . Chlamydia   . Trichomonas   . Herpes   . DVT (deep venous thrombosis)   . Slipped cervical disc    Past Surgical History  Procedure Laterality Date  . Cesarean section    . Wisdom tooth extraction     Family History  Problem Relation Age of Onset  . Adopted: Yes  . Other Neg Hx    History  Substance Use Topics  . Smoking status: Current Some Day Smoker -- 1.50 packs/day for 10 years    Types: Cigarettes  . Smokeless tobacco: Never Used  . Alcohol Use: No   OB History    Gravida Para Term Preterm AB TAB SAB Ectopic Multiple Living   Review of Systems  Constitutional: Positive for chills. Negative for fever.  HENT:  Positive for congestion and sore throat.   Respiratory: Positive for cough. Negative for chest tightness and shortness of breath.   Cardiovascular: Negative for chest pain.  Gastrointestinal: Negative for nausea, vomiting and abdominal pain.  Genitourinary: Negative for dysuria.  Musculoskeletal: Negative for back pain.  Skin: Negative for wound.  Neurological: Positive for numbness. Negative for weakness and headaches.  Psychiatric/Behavioral: Negative for confusion.  All other systems reviewed and are negative.     Allergies  Amoxicillin; Clindamycin/lincomycin; Metronidazole; and Amoxapine and related  Home Medications   Prior to Admission medications   Medication Sig Start Date End Date Taking? Authorizing Provider  guaiFENesin (ROBITUSSIN) 100 MG/5ML liquid Take 100 mg by mouth 3 (three) times daily as needed for cough.   Yes Historical Provider, MD   BP 140/87 mmHg  Pulse 106  Temp(Src) 98.8 F (37.1 C) (Oral)  Resp 18  SpO2 100%  LMP 09/12/2014 Physical Exam  Constitutional: She is oriented to person, place, and time. She appears well-developed and well-nourished.  HENT:  Head: Normocephalic and atraumatic.  Mouth/Throat: No oropharyngeal exudate.  Postnasal drip noted, nasal congestion  Eyes: Pupils are equal, round, and reactive to light.  Neck: Neck supple.  Cardiovascular: Normal rate, regular rhythm and normal heart sounds.   Pulmonary/Chest: Effort normal and breath sounds normal. No respiratory distress. She has  no wheezes.  Abdominal: Soft. Bowel sounds are normal. There is no tenderness. There is no rebound.  Musculoskeletal: She exhibits no edema.  Lymphadenopathy:    She has no cervical adenopathy.  Neurological: She is alert and oriented to person, place, and time.  5 out of 5 strength with plantar and dorsiflexion, normal gait, no clonus, normal reflexes bilaterally  Skin: Skin is warm and dry.  Psychiatric: She has a normal mood and affect.   Nursing note and vitals reviewed.   ED Course  Procedures (including critical care time) Labs Review Labs Reviewed - No data to display  Imaging Review Dg Chest 2 View  09/19/2014   CLINICAL DATA:  Cough. Congestion. Nasal mucosal swelling for 2 days.  EXAM: CHEST  2 VIEW  COMPARISON:  08/18/2013  FINDINGS: The heart size and mediastinal contours are within normal limits. Both lungs are clear. The visualized skeletal structures are unremarkable.  IMPRESSION: No active cardiopulmonary disease.   Electronically Signed   By: Gaylyn RongWalter  Liebkemann M.D.   On: 09/19/2014 09:06     EKG Interpretation None      MDM   Final diagnoses:  Upper respiratory infection   patient presents with upper respiratory symptoms including cough, congestion, and sore throat. Nontoxic on exam. Pulmonary exam is reassuring. Chest x-ray shows no evidence of pneumonia. Suspect viral URI. Patient given Flonase and nasal saline for congestion. Regarding patient's leg numbness, this may be related to known prior herniated disc. No signs of cauda equina.  This is been ongoing and is reported as chronic in nature. Patient needs to establish primary care and may need a repeat MRI. Patient given contact information for [facility].  Patient was instructed to be immediately evaluated she develops weakness, difficulty with her bowel or bladder.  After history, exam, and medical workup I feel the patient has been appropriately medically screened and is safe for discharge home. Pertinent diagnoses were discussed with the patient. Patient was given return precautions.     Shon Batonourtney F Kynlee Koenigsberg, MD 09/19/14 [phone]

## 2014-09-19 NOTE — ED Notes (Signed)
Pt c/o productive cough with yellow phlegm and sore throat x 2 days, pt has nasal congestion as well.   Pt also c/o left leg numbness x couples months.  Pt states that she has a downward turned disc on her left side.  Pt states that she has also been experiencing pain in left leg and swelling in bilat feet.

## 2014-09-19 NOTE — ED Notes (Signed)
Patient transported to X-ray 

## 2015-02-01 ENCOUNTER — Inpatient Hospital Stay (HOSPITAL_COMMUNITY)
Admission: AD | Admit: 2015-02-01 | Discharge: 2015-02-01 | Disposition: A | Discharge status: Home / Self Care | Payer: Self-pay | Source: Ambulatory Visit | Attending: Family Medicine | Admitting: Family Medicine

## 2015-02-01 ENCOUNTER — Encounter (HOSPITAL_COMMUNITY): Payer: Self-pay | Admitting: *Deleted

## 2015-02-01 DIAGNOSIS — A499 Bacterial infection, unspecified: Secondary | ICD-10-CM

## 2015-02-01 DIAGNOSIS — Z202 Contact with and (suspected) exposure to infections with a predominantly sexual mode of transmission: Secondary | ICD-10-CM | POA: Insufficient documentation

## 2015-02-01 DIAGNOSIS — N760 Acute vaginitis: Secondary | ICD-10-CM | POA: Insufficient documentation

## 2015-02-01 DIAGNOSIS — B9689 Other specified bacterial agents as the cause of diseases classified elsewhere: Secondary | ICD-10-CM

## 2015-02-01 LAB — URINALYSIS, ROUTINE W REFLEX MICROSCOPIC
Bilirubin Urine: NEGATIVE
Glucose, UA: NEGATIVE mg/dL
Hgb urine dipstick: NEGATIVE
Ketones, ur: NEGATIVE mg/dL
Leukocytes, UA: NEGATIVE
Nitrite: NEGATIVE
Protein, ur: NEGATIVE mg/dL
SPECIFIC GRAVITY, URINE: 1.025 (ref 1.005–1.030)
Urobilinogen, UA: 0.2 mg/dL (ref 0.0–1.0)
pH: 6.5 (ref 5.0–8.0)

## 2015-02-01 LAB — WET PREP, GENITAL: Trich, Wet Prep: NONE SEEN

## 2015-02-01 LAB — POCT PREGNANCY, URINE: PREG TEST UR: NEGATIVE

## 2015-02-01 MED ORDER — FLUCONAZOLE 150 MG PO TABS: 150.0000 mg | ORAL_TABLET | Freq: Every day | ORAL | Status: DC

## 2015-02-01 MED ORDER — METRONIDAZOLE 500 MG PO TABS: 500.0000 mg | ORAL_TABLET | Freq: Two times a day (BID) | ORAL | Status: DC

## 2015-02-01 NOTE — MAU Note (Signed)
Thinks she has been exposed to STD. States has had a small amount of white vaginal D/C, no odor. Wants UPT. Abnormal amount of bleeding with last period. Has had some nausea. Denies pain.

## 2015-02-01 NOTE — MAU Provider Note (Signed)
History     CSN: [id number]  Arrival date and time: 02/01/15 1022   None     Chief Complaint  Patient presents with  . Exposure to STD   HPI  [name] 29 y.o. [id number] presents to MAU for pregnancy test and STD testing. She has no abnormal discharge but received a phone call that she may have an STD.  Past Medical History  Diagnosis Date  . No pertinent past medical history   . Anxiety   . Chlamydia   . Trichomonas   . Herpes   . DVT (deep venous thrombosis)   . Slipped cervical disc     Past Surgical History  Procedure Laterality Date  . Cesarean section    . Wisdom tooth extraction      Family History  Problem Relation Age of Onset  . Adopted: Yes  . Other Neg Hx     Social History  Substance Use Topics  . Smoking status: Current Some Day Smoker -- 1.50 packs/day for 10 years    Types: Cigarettes  . Smokeless tobacco: Never Used  . Alcohol Use: No    Allergies:  Allergies  Allergen Reactions  . Amoxicillin Other (See Comments)    Skin peeling  . Clindamycin/Lincomycin Other (See Comments)    Skin peels on perineum, lower pubic area, and groin. Same reaction as  amoxicillin  . Metronidazole Other (See Comments)    Skin peels  . Amoxapine And Related Other (See Comments)    Causes skin to peel.    No prescriptions prior to admission    Review of Systems  Constitutional: Negative for fever.  Genitourinary:       Slight vaginal discharge   Physical Exam   Blood pressure 117/77, pulse 89, temperature 98.9 F (37.2 C), temperature source Oral, resp. rate 18, height  (1.651 m), weight 77.565 kg (171 lb), last menstrual period 01/18/2015.  Physical Exam  Nursing note and vitals reviewed. Constitutional: She is oriented to person, place, and time. She appears well-developed. No distress.  HENT:  Head: Normocephalic.  Cardiovascular: Normal rate.   Respiratory: Effort normal. No respiratory distress.  GI: Soft. There is no  tenderness.  Genitourinary: Vaginal discharge found.  White vaginal discharge; No CMT  Musculoskeletal: Normal range of motion.  Neurological: She is alert and oriented to person, place, and time.  Skin: Skin is warm and dry.  Psychiatric: She has a normal mood and affect. Her behavior is normal. Judgment and thought content normal.   Results for orders placed or performed during the hospital encounter of 02/01/15 (from the past 24 hour(s))  Urinalysis, Routine w reflex microscopic (not at [facility])     Status: None   Collection Time: 02/01/15 10:31 AM  Result Value Ref Range   Color, Urine YELLOW YELLOW   APPearance CLEAR CLEAR   Specific Gravity, Urine 1.025 1.005 - 1.030   pH 6.5 5.0 - 8.0   Glucose, UA NEGATIVE NEGATIVE mg/dL   Hgb urine dipstick NEGATIVE NEGATIVE   Bilirubin Urine NEGATIVE NEGATIVE   Ketones, ur NEGATIVE NEGATIVE mg/dL   Protein, ur NEGATIVE NEGATIVE mg/dL   Urobilinogen, UA 0.2 0.0 - 1.0 mg/dL   Nitrite NEGATIVE NEGATIVE   Leukocytes, UA NEGATIVE NEGATIVE  Pregnancy, urine POC     Status: None   Collection Time: 02/01/15 10:46 AM  Result Value Ref Range   Preg Test, Ur NEGATIVE NEGATIVE  Wet prep, genital     Status: Abnormal   Collection  Time: 02/01/15 11:12 AM  Result Value Ref Range   Yeast Wet Prep HPF POC FEW (A) NONE SEEN   Trich, Wet Prep NONE SEEN NONE SEEN   Clue Cells Wet Prep HPF POC MODERATE (A) NONE SEEN   WBC, Wet Prep HPF POC FEW (A) NONE SEEN    MAU Course  Procedures  MDM Pregnancy test is negative; GC; CH wet prep and HIV pending. Pt has BV will treat with flagyl.  Assessment and Plan  Possible Exposure to STD Bacterial Vaginosis Flagyl RX Discharge to home  [facility] 02/01/2015, 11:10 AM

## 2015-02-01 NOTE — Discharge Instructions (Signed)
Bacterial Vaginosis Bacterial vaginosis is a vaginal infection that occurs when the normal balance of bacteria in the vagina is disrupted. It results from an overgrowth of certain bacteria. This is the most common vaginal infection in women of childbearing age. Treatment is important to prevent complications, especially in pregnant women, as it can cause a premature delivery. CAUSES  Bacterial vaginosis is caused by an increase in harmful bacteria that are normally present in smaller amounts in the vagina. Several different kinds of bacteria can cause bacterial vaginosis. However, the reason that the condition develops is not fully understood. RISK FACTORS Certain activities or behaviors can put you at an increased risk of developing bacterial vaginosis, including:  Having a new sex partner or multiple sex partners.  Douching.  Using an intrauterine device (IUD) for contraception. Women do not get bacterial vaginosis from toilet seats, bedding, swimming pools, or contact with objects around them. SIGNS AND SYMPTOMS  Some women with bacterial vaginosis have no signs or symptoms. Common symptoms include:  Grey vaginal discharge.  A fishlike odor with discharge, especially after sexual intercourse.  Itching or burning of the vagina and vulva.  Burning or pain with urination. DIAGNOSIS  Your health care provider will take a medical history and examine the vagina for signs of bacterial vaginosis. A sample of vaginal fluid may be taken. Your health care provider will look at this sample under a microscope to check for bacteria and abnormal cells. A vaginal pH test may also be done.  TREATMENT  Bacterial vaginosis may be treated with antibiotic medicines. These may be given in the form of a pill or a vaginal cream. A second round of antibiotics may be prescribed if the condition comes back after treatment.  HOME CARE INSTRUCTIONS   Only take over-the-counter or prescription medicines as  directed by your health care provider.  If antibiotic medicine was prescribed, take it as directed. Make sure you finish it even if you start to feel better.  Do not have sex until treatment is completed.  Tell all sexual partners that you have a vaginal infection. They should see their health care provider and be treated if they have problems, such as a mild rash or itching.  Practice safe sex by using condoms and only having one sex partner. SEEK MEDICAL CARE IF:   Your symptoms are not improving after 3 days of treatment.  You have increased discharge or pain.  You have a fever. MAKE SURE YOU:   Understand these instructions.  Will watch your condition.  Will get help right away if you are not doing well or get worse. FOR MORE INFORMATION  Centers for Disease Control and Prevention, Division of STD Prevention: www.cdc.gov/std American Sexual Health Association (ASHA): www.ashastd.org  Document Released: 05/13/2005 Document Revised: 03/03/2013 Document Reviewed: 12/23/2012 ExitCare Patient Information 2015 [employer]. This information is not intended to replace advice given to you by your health care provider. Make sure you discuss any questions you have with your health care provider.  

## 2015-02-02 LAB — GC/CHLAMYDIA PROBE AMP (~~LOC~~) NOT AT ARMC
Chlamydia: NEGATIVE
Neisseria Gonorrhea: NEGATIVE

## 2015-02-02 LAB — HIV ANTIBODY (ROUTINE TESTING W REFLEX): HIV Screen 4th Generation wRfx: NONREACTIVE

## 2015-04-22 ENCOUNTER — Encounter (HOSPITAL_COMMUNITY): Payer: Self-pay | Admitting: *Deleted

## 2015-04-22 ENCOUNTER — Inpatient Hospital Stay (HOSPITAL_COMMUNITY)
Admission: AD | Admit: 2015-04-22 | Discharge: 2015-04-22 | Disposition: A | Discharge status: Home / Self Care | Payer: Medicaid Other | Source: Ambulatory Visit | Attending: Obstetrics & Gynecology | Admitting: Obstetrics & Gynecology

## 2015-04-22 ENCOUNTER — Inpatient Hospital Stay (HOSPITAL_COMMUNITY): Payer: Medicaid Other

## 2015-04-22 DIAGNOSIS — R109 Unspecified abdominal pain: Secondary | ICD-10-CM

## 2015-04-22 DIAGNOSIS — N760 Acute vaginitis: Secondary | ICD-10-CM

## 2015-04-22 DIAGNOSIS — O23591 Infection of other part of genital tract in pregnancy, first trimester: Secondary | ICD-10-CM | POA: Insufficient documentation

## 2015-04-22 DIAGNOSIS — R1012 Left upper quadrant pain: Secondary | ICD-10-CM | POA: Diagnosis not present

## 2015-04-22 DIAGNOSIS — R112 Nausea with vomiting, unspecified: Secondary | ICD-10-CM | POA: Diagnosis not present

## 2015-04-22 DIAGNOSIS — A499 Bacterial infection, unspecified: Secondary | ICD-10-CM

## 2015-04-22 DIAGNOSIS — B9689 Other specified bacterial agents as the cause of diseases classified elsewhere: Secondary | ICD-10-CM

## 2015-04-22 DIAGNOSIS — Z3A01 Less than 8 weeks gestation of pregnancy: Secondary | ICD-10-CM | POA: Diagnosis not present

## 2015-04-22 DIAGNOSIS — O26891 Other specified pregnancy related conditions, first trimester: Secondary | ICD-10-CM | POA: Diagnosis present

## 2015-04-22 DIAGNOSIS — O26899 Other specified pregnancy related conditions, unspecified trimester: Secondary | ICD-10-CM

## 2015-04-22 DIAGNOSIS — O3680X0 Pregnancy with inconclusive fetal viability, not applicable or unspecified: Secondary | ICD-10-CM

## 2015-04-22 HISTORY — DX: Phlebitis and thrombophlebitis of superficial vessels of right lower extremity: I80.01

## 2015-04-22 LAB — CBC
HEMATOCRIT: 34.6 % — AB (ref 36.0–46.0)
Hemoglobin: 11.8 g/dL — ABNORMAL LOW (ref 12.0–15.0)
MCH: 31.8 pg (ref 26.0–34.0)
MCHC: 34.1 g/dL (ref 30.0–36.0)
MCV: 93.3 fL (ref 78.0–100.0)
PLATELETS: 248 10*3/uL (ref 150–400)
RBC: 3.71 MIL/uL — ABNORMAL LOW (ref 3.87–5.11)
RDW: 12.4 % (ref 11.5–15.5)
WBC: 11 10*3/uL — ABNORMAL HIGH (ref 4.0–10.5)

## 2015-04-22 LAB — URINALYSIS, ROUTINE W REFLEX MICROSCOPIC
BILIRUBIN URINE: NEGATIVE
Glucose, UA: NEGATIVE mg/dL
HGB URINE DIPSTICK: NEGATIVE
KETONES UR: NEGATIVE mg/dL
Leukocytes, UA: NEGATIVE
NITRITE: NEGATIVE
Protein, ur: NEGATIVE mg/dL
SPECIFIC GRAVITY, URINE: 1.02 (ref 1.005–1.030)
pH: 7 (ref 5.0–8.0)

## 2015-04-22 LAB — WET PREP, GENITAL
Sperm: NONE SEEN
TRICH WET PREP: NONE SEEN
WBC, Wet Prep HPF POC: NONE SEEN
Yeast Wet Prep HPF POC: NONE SEEN

## 2015-04-22 LAB — ABO/RH: ABO/RH(D): O POS

## 2015-04-22 LAB — HCG, QUANTITATIVE, PREGNANCY: HCG, BETA CHAIN, QUANT, S: 140074 m[IU]/mL — AB (ref <5)

## 2015-04-22 LAB — POCT PREGNANCY, URINE: PREG TEST UR: POSITIVE — AB

## 2015-04-22 MED ORDER — CONCEPT OB 130-92.4-1 MG PO CAPS: 1.0000 | ORAL_CAPSULE | Freq: Every day | ORAL | Status: DC

## 2015-04-22 MED ORDER — METRONIDAZOLE 500 MG PO TABS: 500.0000 mg | ORAL_TABLET | Freq: Two times a day (BID) | ORAL | Status: DC

## 2015-04-22 MED ORDER — MECLIZINE HCL 25 MG PO TABS: 25.0000 mg | ORAL_TABLET | Freq: Three times a day (TID) | ORAL | Status: DC | PRN

## 2015-04-22 NOTE — MAU Note (Signed)
Patient presents stating that she has had 2 +HPT with c/o lower abdominal pain X 2 weeks. Denies bleeding or abnormal discharge.

## 2015-04-22 NOTE — Discharge Instructions (Signed)
Bacterial Vaginosis Bacterial vaginosis is a vaginal infection that occurs when the normal balance of bacteria in the vagina is disrupted. It results from an overgrowth of certain bacteria. This is the most common vaginal infection in women of childbearing age. Treatment is important to prevent complications, especially in pregnant women, as it can cause a premature delivery. CAUSES  Bacterial vaginosis is caused by an increase in harmful bacteria that are normally present in smaller amounts in the vagina. Several different kinds of bacteria can cause bacterial vaginosis. However, the reason that the condition develops is not fully understood. RISK FACTORS Certain activities or behaviors can put you at an increased risk of developing bacterial vaginosis, including:  Having a new sex partner or multiple sex partners.  Douching.  Using an intrauterine device (IUD) for contraception. Women do not get bacterial vaginosis from toilet seats, bedding, swimming pools, or contact with objects around them. SIGNS AND SYMPTOMS  Some women with bacterial vaginosis have no signs or symptoms. Common symptoms include:  Grey vaginal discharge.  A fishlike odor with discharge, especially after sexual intercourse.  Itching or burning of the vagina and vulva.  Burning or pain with urination. DIAGNOSIS  Your health care provider will take a medical history and examine the vagina for signs of bacterial vaginosis. A sample of vaginal fluid may be taken. Your health care provider will look at this sample under a microscope to check for bacteria and abnormal cells. A vaginal pH test may also be done.  TREATMENT  Bacterial vaginosis may be treated with antibiotic medicines. These may be given in the form of a pill or a vaginal cream. A second round of antibiotics may be prescribed if the condition comes back after treatment. Because bacterial vaginosis increases your risk for sexually transmitted diseases, getting  treated can help reduce your risk for chlamydia, gonorrhea, HIV, and herpes. HOME CARE INSTRUCTIONS   Only take over-the-counter or prescription medicines as directed by your health care provider.  If antibiotic medicine was prescribed, take it as directed. Make sure you finish it even if you start to feel better.  Tell all sexual partners that you have a vaginal infection. They should see their health care provider and be treated if they have problems, such as a mild rash or itching.  During treatment, it is important that you follow these instructions:  Avoid sexual activity or use condoms correctly.  Do not douche.  Avoid alcohol as directed by your health care provider.  Avoid breastfeeding as directed by your health care provider. SEEK MEDICAL CARE IF:   Your symptoms are not improving after 3 days of treatment.  You have increased discharge or pain.  You have a fever. MAKE SURE YOU:   Understand these instructions.  Will watch your condition.  Will get help right away if you are not doing well or get worse. FOR MORE INFORMATION  Centers for Disease Control and Prevention, Division of STD Prevention: SolutionApps.co.zawww.cdc.gov/std American Sexual Health Association (ASHA): www.ashastd.org    This information is not intended to replace advice given to you by your health care provider. Make sure you discuss any questions you have with your health care provider.   Document Released: 05/13/2005 Document Revised: 06/03/2014 Document Reviewed: 12/23/2012 Elsevier Interactive Patient Education 2016 Elsevier Inc.   Morning Sickness Morning sickness is when you feel sick to your stomach (nauseous) during pregnancy. This nauseous feeling may or may not come with vomiting. It often occurs in the morning but can be a  problem any time of day. Morning sickness is most common during the first trimester, but it may continue throughout pregnancy. While morning sickness is unpleasant, it is usually  harmless unless you develop severe and continual vomiting (hyperemesis gravidarum). This condition requires more intense treatment.  CAUSES  The cause of morning sickness is not completely known but seems to be related to normal hormonal changes that occur in pregnancy. RISK FACTORS You are at greater risk if you:  Experienced nausea or vomiting before your pregnancy.  Had morning sickness during a previous pregnancy.  Are pregnant with more than one baby, such as twins. TREATMENT  Do not use any medicines (prescription, over-the-counter, or herbal) for morning sickness without first talking to your health care provider. Your health care provider may prescribe or recommend:  Vitamin B6 supplements.  Anti-nausea medicines.  The herbal medicine ginger. HOME CARE INSTRUCTIONS   Only take over-the-counter or prescription medicines as directed by your health care provider.  Taking multivitamins before getting pregnant can prevent or decrease the severity of morning sickness in most women.  Eat a piece of dry toast or unsalted crackers before getting out of bed in the morning.  Eat five or six small meals a day.  Eat dry and bland foods (rice, baked potato). Foods high in carbohydrates are often helpful.  Do not drink liquids with your meals. Drink liquids between meals.  Avoid greasy, fatty, and spicy foods.  Get someone to cook for you if the smell of any food causes nausea and vomiting.  If you feel nauseous after taking prenatal vitamins, take the vitamins at night or with a snack.  Snack on protein foods (nuts, yogurt, cheese) between meals if you are hungry.  Eat unsweetened gelatins for desserts.  Wearing an acupressure wristband (worn for sea sickness) may be helpful.  Acupuncture may be helpful.  Do not smoke.  Get a humidifier to keep the air in your house free of odors.  Get plenty of fresh air. SEEK MEDICAL CARE IF:   Your home remedies are not working,  and you need medicine.  You feel dizzy or lightheaded.  You are losing weight. SEEK IMMEDIATE MEDICAL CARE IF:   You have persistent and uncontrolled nausea and vomiting.  You pass out (faint). MAKE SURE YOU:  Understand these instructions.  Will watch your condition.  Will get help right away if you are not doing well or get worse.   This information is not intended to replace advice given to you by your health care provider. Make sure you discuss any questions you have with your health care provider.   Document Released: 07/04/2006 Document Revised: 05/18/2013 Document Reviewed: 10/28/2012 Elsevier Interactive Patient Education [employer]

## 2015-04-22 NOTE — MAU Provider Note (Signed)
Chief Complaint: Abdominal Pain  First Provider Initiated Contact with Patient 04/22/15 1612     SUBJECTIVE HPI: [name] is a 29 y.o. G3P1011 at [redacted]w[redacted]d who presents to Maternity Admissions reporting Pos home UPT and low abd pain and LUQ pain x 2 weeks. Has not had any testing the pregnancy. Has NOB visit scheduled at [facility] 04/28/15.   Location: suprapubic, LLQ and LUQ Quality: cramping Severity: 4/10 on pain scale Duration: 2 weeks Course: Unchanged Timing: intermittent Modifying factors: LUQ pain improves after eating.  Associated signs and symptoms: Pos for N/V (since beginning of pregnancy), loose, frequent stools (x a few days--not watery or bloody). Neg fever, chills, VB, vaginal discharge.   Past Medical History  Diagnosis Date  . No pertinent past medical history   . Anxiety   . Chlamydia   . Trichomonas   . Herpes   . Slipped cervical disc   . Superficial thrombophlebitis of right leg 2015   OB History  Gravida Para Term Preterm AB SAB TAB Ectopic Multiple Living  # Outcome Date GA Lbr Len/2nd Weight Sex Delivery Anes PTL Lv  3 Current           2 Term 08/22/09    M CS-LTranv     1 SAB 2009             Past Surgical History  Procedure Laterality Date  . Cesarean section    . Wisdom tooth extraction     Social History   Social History  . Marital Status: Single    Spouse Name: N/A  . Number of Children: N/A  . Years of Education: N/A   Occupational History  . Not on file.   Social History Main Topics  . Smoking status: Current Some Day Smoker -- 1.50 packs/day for 10 years    Types: Cigarettes  . Smokeless tobacco: Never Used  . Alcohol Use: No  . Drug Use: No  . Sexual Activity: Yes    Birth Control/ Protection: None   Other Topics Concern  . Not on file   Social History Narrative   No current facility-administered medications on file prior to encounter.   Current Outpatient Prescriptions on File Prior to Encounter   Medication Sig Dispense Refill  . fluconazole (DIFLUCAN) 150 MG tablet Take 1 tablet (150 mg total) by mouth daily. Repeat dose in 72 hours (Patient not taking: Reported on 04/22/2015) 2 tablet 1  . metroNIDAZOLE (FLAGYL) 500 MG tablet Take 1 tablet (500 mg total) by mouth 2 (two) times daily. (Patient not taking: Reported on 04/22/2015) 14 tablet 0   Allergies  Allergen Reactions  . Amoxicillin Other (See Comments)    Yeast infection  . Clindamycin/Lincomycin Other (See Comments)    Skin peels on perineum, lower pubic area, and groin. Same reaction as  amoxicillin  . Metronidazole Other (See Comments)    Pt states this medication causes yeast infections causing skin to peel locally to groin.   . Amoxapine And Related Other (See Comments)    Pt states medication causes yeast infections causing skin to peel around groin    I have reviewed the past Medical Hx, Surgical Hx, Social Hx, Allergies and Medications.   Review of Systems  Constitutional: Negative for fever, chills and appetite change.  Gastrointestinal: Positive for nausea, vomiting, abdominal pain and diarrhea (frequent stools.). Negative for constipation and blood in stool.  Genitourinary: Positive for urgency.  Negative for dysuria, frequency, hematuria, flank pain, vaginal bleeding, vaginal discharge and pelvic pain.  Musculoskeletal: Negative for back pain.    OBJECTIVE Patient Vitals for the past 24 hrs:  BP Temp Temp src Pulse Resp Height Weight  04/22/15 1753 (!) 107/51 mmHg - - 80 16 - -  04/22/15 1524 125/71 mmHg 98.2 F (36.8 C) Oral 80 16  (1.651 m) 175 lb (79.379 kg)   Constitutional: Well-developed, well-nourished female in no acute distress.  Cardiovascular: normal rate Respiratory: normal rate and effort.  GI: Abd soft, mild suprapubic, LLQ and LUQ tenderness. No guarding, rebound tenderness or mass. Pos BS x 4 MS: Extremities nontender, no edema, normal ROM Neurologic: Alert and oriented x 4.  GU:  Neg CVAT.  SPECULUM EXAM: NEFG, physiologic discharge, no blood noted, cervix clean  BIMANUAL: cervix closed; uterus top-normal size, no adnexal tenderness or masses. No CMT.  LAB RESULTS Results for orders placed or performed during the hospital encounter of 04/22/15 (from the past 24 hour(s))  hCG, quantitative, pregnancy     Status: Abnormal   Collection Time: 04/22/15  3:55 PM  Result Value Ref Range   hCG, Beta Chain, Quant, S 140074 (H) <5 mIU/mL  CBC     Status: Abnormal   Collection Time: 04/22/15  3:55 PM  Result Value Ref Range   WBC 11.0 (H) 4.0 - 10.5 K/uL   RBC 3.71 (L) 3.87 - 5.11 MIL/uL   Hemoglobin 11.8 (L) 12.0 - 15.0 g/dL   HCT 16.1 (L) 09.6 - 04.5 %   MCV 93.3 78.0 - 100.0 fL   MCH 31.8 26.0 - 34.0 pg   MCHC 34.1 30.0 - 36.0 g/dL   RDW 40.9 81.1 - 91.4 %   Platelets 248 150 - 400 K/uL  ABO/Rh     Status: None   Collection Time: 04/22/15  3:55 PM  Result Value Ref Range   ABO/RH(D) O POS   Pregnancy, urine POC     Status: Abnormal   Collection Time: 04/22/15  3:57 PM  Result Value Ref Range   Preg Test, Ur POSITIVE (A) NEGATIVE  Urinalysis, Routine w reflex microscopic (not at [facility])     Status: None   Collection Time: 04/22/15  4:02 PM  Result Value Ref Range   Color, Urine YELLOW YELLOW   APPearance CLEAR CLEAR   Specific Gravity, Urine 1.020 1.005 - 1.030   pH 7.0 5.0 - 8.0   Glucose, UA NEGATIVE NEGATIVE mg/dL   Hgb urine dipstick NEGATIVE NEGATIVE   Bilirubin Urine NEGATIVE NEGATIVE   Ketones, ur NEGATIVE NEGATIVE mg/dL   Protein, ur NEGATIVE NEGATIVE mg/dL   Nitrite NEGATIVE NEGATIVE   Leukocytes, UA NEGATIVE NEGATIVE  Wet prep, genital     Status: Abnormal   Collection Time: 04/22/15  4:02 PM  Result Value Ref Range   Yeast Wet Prep HPF POC NONE SEEN NONE SEEN   Trich, Wet Prep NONE SEEN NONE SEEN   Clue Cells Wet Prep HPF POC PRESENT (A) NONE SEEN   WBC, Wet Prep HPF POC NONE SEEN NONE SEEN   Sperm NONE SEEN     IMAGING US Ob Comp Less  14 Wks  04/22/2015  CLINICAL DATA:  First trimester pregnancy, pain EXAM: OBSTETRIC <14 WK [place] AND TRANSVAGINAL OB US TECHNIQUE: Both transabdominal and transvaginal ultrasound examinations were performed for complete evaluation of the gestation as well as the maternal uterus, adnexal regions, and pelvic cul-de-sac. Transvaginal technique was performed to assess early pregnancy. COMPARISON:  None. FINDINGS: Intrauterine gestational sac: Visualized/normal in shape. Yolk sac:  Present Embryo:  Present Cardiac Activity: Visualized Heart Rate: 138  bpm CRL:  7  mm   6 w   for d                  US EDC: 12/12/2015 Maternal uterus/adnexae: Small subchorionic hemorrhage. Corpus luteum right ovary. Ovaries appear normal. No free fluid. IMPRESSION: Live intrauterine gestation with small subchorionic hemorrhage Electronically Signed   By: Esperanza Heiraymond  Rubner M.D.   On: 04/22/2015 17:02   [place] Ob Transvaginal  04/22/2015  CLINICAL DATA:  First trimester pregnancy, pain EXAM: OBSTETRIC <14 WK US AND TRANSVAGINAL OB US TECHNIQUE: Both transabdominal and transvaginal ultrasound examinations were performed for complete evaluation of the gestation as well as the maternal uterus, adnexal regions, and pelvic cul-de-sac. Transvaginal technique was performed to assess early pregnancy. COMPARISON:  None. FINDINGS: Intrauterine gestational sac: Visualized/normal in shape. Yolk sac:  Present Embryo:  Present Cardiac Activity: Visualized Heart Rate: 138  bpm CRL:  7  mm   6 w   for d                  US EDC: 12/12/2015 Maternal uterus/adnexae: Small subchorionic hemorrhage. Corpus luteum right ovary. Ovaries appear normal. No free fluid. IMPRESSION: Live intrauterine gestation with small subchorionic hemorrhage Electronically Signed   By: Esperanza Heiraymond  Rubner M.D.   On: 04/22/2015 17:02    MAU COURSE CBC, CMP, ABO/Rh, ultrasound, wet prep and GC/chlamydia culture, UA  MDM Pain in early pregnancy with normal intrauterine pregnancy and  hemodynamically stable.  LUQ pain non-emergent-appearing, likely R/T N/V of pregnancy.  ASSESSMENT 1. Abdominal pain affecting pregnancy, antepartum   2. Encounter to determine fetal viability of pregnancy, not applicable or unspecified fetus   3. BV (bacterial vaginosis)    PLAN Discharge home in stable condition. First trimester precautions Pregnancy verification letter given GC/Chlamydia pending Comfort measures. Tylenol PRN. B6 and Unisom or Meclizine for N/V.     Follow-up Information    [facility]   Why:  Start prenatal care   Contact information:   [address] [place] [address] [phone]       [facility].   Why:  As needed in emergencies   Contact information:   [address] [id number] [place] [phone]        Medication List    STOP taking these medications        fluconazole 150 MG tablet  Commonly known as:  DIFLUCAN      TAKE these medications        CONCEPT OB 130-92.4-1 MG Caps  Take 1 tablet by mouth daily.     meclizine 25 MG tablet  Commonly known as:  ANTIVERT  Take 1 tablet (25 mg total) by mouth 3 (three) times daily as needed for nausea.     metroNIDAZOLE 500 MG tablet  Commonly known as:  FLAGYL  Take 1 tablet (500 mg total) by mouth 2 (two) times daily.         TindallVirginia Saron Tweed, CNM 04/22/2015  6:01 PM  4

## 2015-04-23 LAB — HIV ANTIBODY (ROUTINE TESTING W REFLEX): HIV Screen 4th Generation wRfx: NONREACTIVE

## 2015-04-24 LAB — POCT PREGNANCY, URINE: Preg Test, Ur: POSITIVE — AB

## 2015-04-24 LAB — GC/CHLAMYDIA PROBE AMP (~~LOC~~) NOT AT ARMC
Chlamydia: NEGATIVE
Neisseria Gonorrhea: NEGATIVE

## 2015-04-25 ENCOUNTER — Telehealth: Payer: Self-pay | Admitting: Advanced Practice Midwife

## 2015-04-25 MED ORDER — METRONIDAZOLE 0.75 % VA GEL
1.0000 | Freq: Every day | VAGINAL | Status: DC
Start: 2015-04-25 — End: 2015-06-06

## 2015-04-25 MED ORDER — PROMETHAZINE HCL 25 MG PO TABS: 25.0000 mg | ORAL_TABLET | Freq: Four times a day (QID) | ORAL | Status: DC | PRN

## 2015-04-25 NOTE — Telephone Encounter (Signed)
Was seen in maternity admissions 04/22/2015 and diagnosed with bacterial vaginosis. Came to MAU task stating that she is having nausea and vomiting with Flagyl and is requesting a different medication. Has mild allergy to Flagyl (rash) and states she is usually able to tolerate it. Able to use MetroGel. Rx given. Also given Phenergan to take with oral Flagyl if MetroGel is too expensive.

## 2015-05-26 LAB — OB RESULTS CONSOLE GC/CHLAMYDIA
CHLAMYDIA, DNA PROBE: NEGATIVE
Gonorrhea: NEGATIVE

## 2015-05-26 LAB — OB RESULTS CONSOLE HIV ANTIBODY (ROUTINE TESTING): HIV: NONREACTIVE

## 2015-05-26 LAB — OB RESULTS CONSOLE RUBELLA ANTIBODY, IGM: Rubella: IMMUNE

## 2015-05-26 LAB — OB RESULTS CONSOLE RPR: RPR: NONREACTIVE

## 2015-05-26 LAB — OB RESULTS CONSOLE ANTIBODY SCREEN: Antibody Screen: NEGATIVE

## 2015-05-26 LAB — OB RESULTS CONSOLE ABO/RH: RH TYPE: POSITIVE

## 2015-05-26 LAB — OB RESULTS CONSOLE HEPATITIS B SURFACE ANTIGEN: HEP B S AG: NEGATIVE

## 2015-05-28 HISTORY — DX: Maternal care for unspecified type scar from previous cesarean delivery: O34.219

## 2015-05-28 NOTE — L&D Delivery Note (Signed)
Delivery Note Pt pushed very well for about 25 min for delivery.  At 2:49 AM a viable and healthy female was delivered via Vaginal, Spontaneous Delivery (Presentation: Right Occiput Anterior).  APGAR: 8, 9; weight P .   Placenta status: Intact, Spontaneous.  Cord: 3 vessels with the following complications: None.    Anesthesia: Epidural  Episiotomy: None Lacerations: Left Labial;Vaginal Suture Repair: 3.0 vicryl rapide Est. Blood Loss (mL): 250cc  Mom to postpartum.  Baby to Couplet care / Skin to Skin.  [name], [name] 12/10/2015, 3:34 AM  Br/Depo/RI/ Tdap in PNC/O+

## 2015-06-06 ENCOUNTER — Encounter (HOSPITAL_COMMUNITY): Payer: Self-pay | Admitting: *Deleted

## 2015-06-06 ENCOUNTER — Inpatient Hospital Stay (HOSPITAL_COMMUNITY)
Admission: AD | Admit: 2015-06-06 | Discharge: 2015-06-06 | Disposition: A | Discharge status: Home / Self Care | Payer: Medicaid Other | Source: Ambulatory Visit | Attending: Obstetrics and Gynecology | Admitting: Obstetrics and Gynecology

## 2015-06-06 DIAGNOSIS — E860 Dehydration: Secondary | ICD-10-CM | POA: Diagnosis not present

## 2015-06-06 DIAGNOSIS — Z880 Allergy status to penicillin: Secondary | ICD-10-CM | POA: Diagnosis not present

## 2015-06-06 DIAGNOSIS — A749 Chlamydial infection, unspecified: Secondary | ICD-10-CM | POA: Diagnosis not present

## 2015-06-06 DIAGNOSIS — A599 Trichomoniasis, unspecified: Secondary | ICD-10-CM | POA: Insufficient documentation

## 2015-06-06 DIAGNOSIS — F419 Anxiety disorder, unspecified: Secondary | ICD-10-CM | POA: Diagnosis not present

## 2015-06-06 DIAGNOSIS — O219 Vomiting of pregnancy, unspecified: Secondary | ICD-10-CM | POA: Insufficient documentation

## 2015-06-06 DIAGNOSIS — Z3A13 13 weeks gestation of pregnancy: Secondary | ICD-10-CM | POA: Insufficient documentation

## 2015-06-06 DIAGNOSIS — F17200 Nicotine dependence, unspecified, uncomplicated: Secondary | ICD-10-CM | POA: Diagnosis not present

## 2015-06-06 DIAGNOSIS — O9989 Other specified diseases and conditions complicating pregnancy, childbirth and the puerperium: Secondary | ICD-10-CM | POA: Insufficient documentation

## 2015-06-06 DIAGNOSIS — A084 Viral intestinal infection, unspecified: Secondary | ICD-10-CM | POA: Diagnosis not present

## 2015-06-06 DIAGNOSIS — Z881 Allergy status to other antibiotic agents status: Secondary | ICD-10-CM | POA: Diagnosis not present

## 2015-06-06 DIAGNOSIS — R197 Diarrhea, unspecified: Secondary | ICD-10-CM | POA: Insufficient documentation

## 2015-06-06 DIAGNOSIS — B009 Herpesviral infection, unspecified: Secondary | ICD-10-CM | POA: Diagnosis not present

## 2015-06-06 DIAGNOSIS — Z888 Allergy status to other drugs, medicaments and biological substances status: Secondary | ICD-10-CM | POA: Diagnosis not present

## 2015-06-06 LAB — URINALYSIS, ROUTINE W REFLEX MICROSCOPIC
Glucose, UA: NEGATIVE mg/dL
HGB URINE DIPSTICK: NEGATIVE
Ketones, ur: >80 mg/dL — AB
Leukocytes, UA: NEGATIVE
Nitrite: NEGATIVE
PH: 6 (ref 5.0–8.0)
Protein, ur: NEGATIVE mg/dL
SPECIFIC GRAVITY, URINE: 1.025 (ref 1.005–1.030)

## 2015-06-06 MED ORDER — DEXTROSE 5 % IN LACTATED RINGERS IV BOLUS
1000.0000 mL | Freq: Once | INTRAVENOUS | Status: AC
  Administered 2015-06-06: 1000 mL via INTRAVENOUS

## 2015-06-06 MED ORDER — LACTATED RINGERS IV BOLUS (SEPSIS)
1000.0000 mL | Freq: Once | INTRAVENOUS | Status: AC
  Administered 2015-06-06: 1000 mL via INTRAVENOUS

## 2015-06-06 MED ORDER — PROMETHAZINE HCL 25 MG/ML IJ SOLN
25.0000 mg | Freq: Once | INTRAMUSCULAR | Status: AC
  Administered 2015-06-06: 25 mg via INTRAVENOUS
  Filled 2015-06-06: qty 1

## 2015-06-06 NOTE — MAU Note (Addendum)
N/V/D today. Son had 'stomach bug' yesterday. Vomited about about 5 times and then went to sleep this am. Tried ice water and did not stay down. Diarrhea x 2 earlier today. Has promethazine at home but "does not like to take pills". Has had headache since 1300

## 2015-06-06 NOTE — MAU Provider Note (Signed)
Chief Complaint: Emesis During Pregnancy and Diarrhea   None     SUBJECTIVE HPI: [name] is a 30 y.o. G3P1011 at 3045w0d by LMP who presents to maternity admissions reporting n/v/d starting this morning. Her son was sick with GI virus yesterday and she slept in bed with him last night.  She reports diarrhea started this morning with 2-3 episodes when she woke up.  Then, she took a bath and her stomach felt better but when she got out of the bath she drank some water then threw it up. She was able to sleep for a couple of hours, then woke up vomiting and has been unable to keep down any food or fluids since.  She has Phenergan tablets at home but has not taken anything because she does not like to take medicine.  She denies abdominal pain, vaginal bleeding, vaginal itching/burning, urinary symptoms, h/a, dizziness, or fever/chills.     HPI  Past Medical History  Diagnosis Date  . No pertinent past medical history   . Anxiety   . Chlamydia   . Trichomonas   . Herpes   . Slipped cervical disc   . Superficial thrombophlebitis of right leg 2015   Past Surgical History  Procedure Laterality Date  . Cesarean section    . Wisdom tooth extraction     Social History   Social History  . Marital Status: Single    Spouse Name: N/A  . Number of Children: N/A  . Years of Education: N/A   Occupational History  . Not on file.   Social History Main Topics  . Smoking status: Current Some Day Smoker -- 1.50 packs/day for 10 years    Types: Cigarettes  . Smokeless tobacco: Never Used  . Alcohol Use: No  . Drug Use: No  . Sexual Activity: Yes    Birth Control/ Protection: None   Other Topics Concern  . Not on file   Social History Narrative   No current facility-administered medications on file prior to encounter.   Current Outpatient Prescriptions on File Prior to Encounter  Medication Sig Dispense Refill  . Prenat w/o A Vit-FeFum-FePo-FA (CONCEPT OB) 130-92.4-1 MG CAPS Take  1 tablet by mouth daily. 30 capsule 12  . promethazine (PHENERGAN) 25 MG tablet Take 1 tablet (25 mg total) by mouth every 6 (six) hours as needed. 30 tablet 1   Allergies  Allergen Reactions  . Amoxicillin Other (See Comments)    Yeast infection  . Clindamycin/Lincomycin Other (See Comments)    Skin peels on perineum, lower pubic area, and groin. Same reaction as  amoxicillin  . Metronidazole Other (See Comments)    Pt states this medication causes yeast infections causing skin to peel locally to groin.   . Amoxapine And Related Other (See Comments)    Pt states medication causes yeast infections causing skin to peel around groin    ROS:  Review of Systems  Constitutional: Negative for fever, chills and fatigue.  Respiratory: Negative for shortness of breath.   Cardiovascular: Negative for chest pain.  Gastrointestinal: Positive for nausea, vomiting and diarrhea. Negative for abdominal pain.  Genitourinary: Negative for dysuria, flank pain, vaginal bleeding, vaginal discharge, difficulty urinating, vaginal pain and pelvic pain.  Neurological: Negative for dizziness and headaches.  Psychiatric/Behavioral: Negative.      I have reviewed patient's Past Medical Hx, Surgical Hx, Family Hx, Social Hx, medications and allergies.   Physical Exam   Patient Vitals for the past 24 hrs:  BP Temp Pulse Resp Height Weight  06/06/15 1523 109/58 mmHg 98.6 F (37 C) 79 18 - -  06/06/15 1512 - - - - 5\' 5"  (1.651 m) 176 lb 3.2 oz (79.924 kg)   Constitutional: Well-developed, well-nourished female in no acute distress.  Cardiovascular: normal rate Respiratory: normal effort GI: Abd soft, non-tender. Pos BS x 4 MS: Extremities nontender, no edema, normal ROM Neurologic: Alert and oriented x 4.  GU: Neg CVAT.  FHT 161 by doppler  LAB RESULTS Results for orders placed or performed during the hospital encounter of 06/06/15 (from the past 24 hour(s))  Urinalysis, Routine w reflex microscopic  (not at [facility])     Status: Abnormal   Collection Time: 06/06/15  3:10 PM  Result Value Ref Range   Color, Urine YELLOW YELLOW   APPearance CLEAR CLEAR   Specific Gravity, Urine 1.025 1.005 - 1.030   pH 6.0 5.0 - 8.0   Glucose, UA NEGATIVE NEGATIVE mg/dL   Hgb urine dipstick NEGATIVE NEGATIVE   Bilirubin Urine SMALL (A) NEGATIVE   Ketones, ur >80 (A) NEGATIVE mg/dL   Protein, ur NEGATIVE NEGATIVE mg/dL   Nitrite NEGATIVE NEGATIVE   Leukocytes, UA NEGATIVE NEGATIVE    --/--/O POS (11/26 1555)  IMAGING No results found.  MAU Management/MDM: Ordered labs and reviewed results.  Mild dehydration noted on UA so IV fluids and Phenergan IV given.  Pt reports improvement in symptoms. She denies vomiting or diarrhea while in MAU.  Consult Dr Senaida Ores.  Treatments in MAU included Phenergan 25 mg IV, LR x 1000 ml, D5LR x 1000 ml.  Pt to take Phenergan at home as prescribed and may take Imodium OTC as needed for diarrhea. Pt stable at time of discharge.  ASSESSMENT 1. Mild dehydration   2. Viral gastroenteritis   3. Nausea and vomiting during pregnancy prior to [redacted] weeks gestation     PLAN Discharge home Take Phenergan and Imodium at home as needed Increase PO fluids   Follow-up Information    Follow up with Oliver Pila, MD.   Specialty:  Obstetrics and Gynecology   Why:  As scheduled   Contact information:   [address] [place] [address] [phone]       Please follow up.   Why:  Take Phenergan 12.5-25 mg every 6 hours as needed.  You may take Imodium OTC as needed for diarrhea.      Sharen Counter Certified Nurse-Midwife 06/06/2015  5:33 PM

## 2015-06-06 NOTE — Discharge Instructions (Signed)

## 2015-11-05 ENCOUNTER — Encounter (HOSPITAL_COMMUNITY): Payer: Self-pay

## 2015-11-05 ENCOUNTER — Inpatient Hospital Stay (HOSPITAL_COMMUNITY)
Admission: AD | Admit: 2015-11-05 | Discharge: 2015-11-05 | Disposition: A | Discharge status: Home / Self Care | Payer: Medicaid Other | Source: Ambulatory Visit | Attending: Obstetrics and Gynecology | Admitting: Obstetrics and Gynecology

## 2015-11-05 DIAGNOSIS — Z880 Allergy status to penicillin: Secondary | ICD-10-CM | POA: Insufficient documentation

## 2015-11-05 DIAGNOSIS — Z3A34 34 weeks gestation of pregnancy: Secondary | ICD-10-CM | POA: Insufficient documentation

## 2015-11-05 DIAGNOSIS — O9989 Other specified diseases and conditions complicating pregnancy, childbirth and the puerperium: Secondary | ICD-10-CM

## 2015-11-05 DIAGNOSIS — O26899 Other specified pregnancy related conditions, unspecified trimester: Secondary | ICD-10-CM

## 2015-11-05 DIAGNOSIS — R109 Unspecified abdominal pain: Secondary | ICD-10-CM | POA: Diagnosis present

## 2015-11-05 DIAGNOSIS — Z87891 Personal history of nicotine dependence: Secondary | ICD-10-CM | POA: Insufficient documentation

## 2015-11-05 DIAGNOSIS — O26893 Other specified pregnancy related conditions, third trimester: Secondary | ICD-10-CM | POA: Insufficient documentation

## 2015-11-05 DIAGNOSIS — R1030 Lower abdominal pain, unspecified: Secondary | ICD-10-CM | POA: Insufficient documentation

## 2015-11-05 LAB — URINALYSIS, ROUTINE W REFLEX MICROSCOPIC
Bilirubin Urine: NEGATIVE
GLUCOSE, UA: NEGATIVE mg/dL
HGB URINE DIPSTICK: NEGATIVE
KETONES UR: 15 mg/dL — AB
LEUKOCYTES UA: NEGATIVE
Nitrite: NEGATIVE
PH: 6.5 (ref 5.0–8.0)
Protein, ur: NEGATIVE mg/dL
Specific Gravity, Urine: 1.01 (ref 1.005–1.030)

## 2015-11-05 NOTE — MAU Note (Signed)
Pt c/o generalized abdominal pain in lower abdomen since last night that comes and goes. Pt feels sharp pain at her c/section incision site. Pt also feels some pain on her left side coming around from her back. Pt denies bleeding. Pt denies leaking of fluid. Pt states baby is moving normally.

## 2015-11-05 NOTE — MAU Provider Note (Signed)
History     CSN: [id number]  Arrival date and time: 11/05/15 1302   First Provider Initiated Contact with Patient 11/05/15 1340      Chief Complaint  Patient presents with  . Abdominal Pain   HPI  [name] is a 30 y.o. G3P1011 at 3749w5d. She presents with low ML abd pain since last night. It was constant cramping x 2 hr , then resolved. She went to work this am, the pain returned and they sent her here. She describes the pain as cramping/burning in low LM. Sh denies bleeding, leaking or regular contractions. Good fetal movement. No urinary frequency , urgency or dysuria. She is starting to have tingling/numbness in her hands- ? Carpel tunnel and swelling in her feet. She c/o low left back aching, her reflux is getting worse.  Next appt Wed with Dr Jackelyn KnifeMeisinger.   OB History    Gravida Para Term Preterm AB TAB SAB Ectopic Multiple Living   3 1 1  1  1   1       Past Medical History  Diagnosis Date  . No pertinent past medical history   . Anxiety   . Chlamydia   . Trichomonas   . Herpes   . Slipped cervical disc   . Superficial thrombophlebitis of right leg 2015    Past Surgical History  Procedure Laterality Date  . Cesarean section    . Wisdom tooth extraction      Family History  Problem Relation Age of Onset  . Adopted: Yes  . Other Neg Hx     Social History  Substance Use Topics  . Smoking status: Former Smoker -- 1.50 packs/day for 10 years    Types: Cigarettes    Quit date: 04/23/2015  . Smokeless tobacco: Never Used  . Alcohol Use: No    Allergies:  Allergies  Allergen Reactions  . Amoxicillin Other (See Comments)    Yeast infection Has patient had a PCN reaction causing immediate rash, facial/tongue/throat swelling, SOB or lightheadedness with hypotension: No Has patient had a PCN reaction causing severe rash involving mucus membranes or skin necrosis: No Has patient had a PCN reaction that required hospitalization No Has patient had a PCN  reaction occurring within the last 10 years: Non  If all of the above answers are "NO", then may proceed with Cephalosporin use.   . Clindamycin/Lincomycin Other (See Comments)    Skin peels on perineum, lower pubic area, and groin. Same reaction as  amoxicillin  . Metronidazole Other (See Comments)    Pt states this medication causes yeast infections causing skin to peel locally to groin.   . Amoxapine And Related Other (See Comments)    Pt states medication causes yeast infections causing skin to peel around groin    Prescriptions prior to admission  Medication Sig Dispense Refill Last Dose  . OVER THE COUNTER MEDICATION Take 1 tablet by mouth daily. Over the counter acid reducer   Past Week at Unknown time  . Prenat w/o A Vit-FeFum-FePo-FA (CONCEPT OB) 130-92.4-1 MG CAPS Take 1 tablet by mouth daily. 30 capsule 12 Past Week at Unknown time    Review of Systems  Constitutional: Negative for fever and chills.  Gastrointestinal: Positive for heartburn and abdominal pain. Negative for nausea, vomiting, diarrhea and constipation.  Genitourinary: Negative for dysuria, urgency and frequency.       + fetal movement   Physical Exam   Blood pressure 127/74, pulse 104, temperature 98.2 F (36.8 C),  temperature source Oral, resp. rate 17, height  (1.651 m), weight 205 lb (92.987 kg), last menstrual period 02/27/2015, SpO2 99 %.  Physical Exam  Nursing note and vitals reviewed. Constitutional: She is oriented to person, place, and time. She appears well-developed and well-nourished.  GI: Soft. There is no tenderness. There is no rebound and no guarding.  Genitourinary: Vagina normal.  Cx closed, long, soft, posterior  Musculoskeletal: Normal range of motion.  Neurological: She is alert and oriented to person, place, and time.  Skin: Skin is warm and dry.  Psychiatric: She has a normal mood and affect. Her behavior is normal.    MAU Course  Procedures  MDM Results for orders  placed or performed during the hospital encounter of 11/05/15 (from the past 24 hour(s))  Urinalysis, Routine w reflex microscopic (not at [facility])     Status: Abnormal   Collection Time: 11/05/15  1:10 PM  Result Value Ref Range   Color, Urine YELLOW YELLOW   APPearance CLEAR CLEAR   Specific Gravity, Urine 1.010 1.005 - 1.030   pH 6.5 5.0 - 8.0   Glucose, UA NEGATIVE NEGATIVE mg/dL   Hgb urine dipstick NEGATIVE NEGATIVE   Bilirubin Urine NEGATIVE NEGATIVE   Ketones, ur 15 (A) NEGATIVE mg/dL   Protein, ur NEGATIVE NEGATIVE mg/dL   Nitrite NEGATIVE NEGATIVE   Leukocytes, UA NEGATIVE NEGATIVE    Reactive FM strip  Assessment and Plan  34 5/7 wks with abd pain  Cx closed, UA-15 ketones, no contractions, reactive strip D/C home, to keep appt 6/14 in the office Increase fluids Consulted with Dr Barnet Pall, Jessica Scheff M. 11/05/2015, 2:31 PM

## 2015-11-08 LAB — OB RESULTS CONSOLE GBS: GBS: NEGATIVE

## 2015-12-06 ENCOUNTER — Encounter (HOSPITAL_COMMUNITY): Payer: Self-pay

## 2015-12-06 ENCOUNTER — Telehealth (HOSPITAL_COMMUNITY): Payer: Self-pay | Admitting: *Deleted

## 2015-12-06 NOTE — Telephone Encounter (Signed)
Preadmission screen  

## 2015-12-09 ENCOUNTER — Inpatient Hospital Stay (HOSPITAL_COMMUNITY)
Admission: AD | Admit: 2015-12-09 | Discharge: 2015-12-12 | DRG: 774 | Disposition: A | Discharge status: Home / Self Care | Payer: Medicaid Other | Source: Ambulatory Visit | Attending: Obstetrics and Gynecology | Admitting: Obstetrics and Gynecology

## 2015-12-09 ENCOUNTER — Encounter (HOSPITAL_COMMUNITY): Payer: Self-pay | Admitting: *Deleted

## 2015-12-09 ENCOUNTER — Inpatient Hospital Stay (HOSPITAL_COMMUNITY): Payer: Medicaid Other | Admitting: Anesthesiology

## 2015-12-09 DIAGNOSIS — O9832 Other infections with a predominantly sexual mode of transmission complicating childbirth: Secondary | ICD-10-CM | POA: Diagnosis present

## 2015-12-09 DIAGNOSIS — O34211 Maternal care for low transverse scar from previous cesarean delivery: Secondary | ICD-10-CM | POA: Diagnosis present

## 2015-12-09 DIAGNOSIS — Z3483 Encounter for supervision of other normal pregnancy, third trimester: Secondary | ICD-10-CM | POA: Diagnosis present

## 2015-12-09 DIAGNOSIS — K219 Gastro-esophageal reflux disease without esophagitis: Secondary | ICD-10-CM | POA: Diagnosis present

## 2015-12-09 DIAGNOSIS — Z3A39 39 weeks gestation of pregnancy: Secondary | ICD-10-CM | POA: Diagnosis not present

## 2015-12-09 DIAGNOSIS — A6000 Herpesviral infection of urogenital system, unspecified: Secondary | ICD-10-CM | POA: Diagnosis present

## 2015-12-09 DIAGNOSIS — Z87891 Personal history of nicotine dependence: Secondary | ICD-10-CM

## 2015-12-09 DIAGNOSIS — O34219 Maternal care for unspecified type scar from previous cesarean delivery: Secondary | ICD-10-CM

## 2015-12-09 DIAGNOSIS — O9962 Diseases of the digestive system complicating childbirth: Secondary | ICD-10-CM | POA: Diagnosis present

## 2015-12-09 DIAGNOSIS — IMO0001 Reserved for inherently not codable concepts without codable children: Secondary | ICD-10-CM

## 2015-12-09 LAB — CBC
HEMATOCRIT: 32.7 % — AB (ref 36.0–46.0)
HEMOGLOBIN: 11.2 g/dL — AB (ref 12.0–15.0)
MCH: 30.9 pg (ref 26.0–34.0)
MCHC: 34.3 g/dL (ref 30.0–36.0)
MCV: 90.1 fL (ref 78.0–100.0)
Platelets: 244 10*3/uL (ref 150–400)
RBC: 3.63 MIL/uL — ABNORMAL LOW (ref 3.87–5.11)
RDW: 13.9 % (ref 11.5–15.5)
WBC: 12.5 10*3/uL — ABNORMAL HIGH (ref 4.0–10.5)

## 2015-12-09 LAB — TYPE AND SCREEN
ABO/RH(D): O POS
Antibody Screen: NEGATIVE

## 2015-12-09 LAB — RPR: RPR Ser Ql: NONREACTIVE

## 2015-12-09 MED ORDER — OXYCODONE-ACETAMINOPHEN 5-325 MG PO TABS: 1.0000 | ORAL_TABLET | ORAL | Status: DC | PRN

## 2015-12-09 MED ORDER — LACTATED RINGERS IV SOLN: 500.0000 mL | Freq: Once | INTRAVENOUS | Status: DC

## 2015-12-09 MED ORDER — OXYTOCIN BOLUS FROM INFUSION
500.0000 mL | INTRAVENOUS | Status: DC
  Administered 2015-12-10: 500 mL via INTRAVENOUS

## 2015-12-09 MED ORDER — EPHEDRINE 5 MG/ML INJ
10.0000 mg | INTRAVENOUS | Status: DC | PRN
  Filled 2015-12-09: qty 2

## 2015-12-09 MED ORDER — OXYCODONE-ACETAMINOPHEN 5-325 MG PO TABS: 2.0000 | ORAL_TABLET | ORAL | Status: DC | PRN

## 2015-12-09 MED ORDER — LIDOCAINE HCL (PF) 1 % IJ SOLN
INTRAMUSCULAR | Status: DC | PRN
  Administered 2015-12-09: 6 mL via EPIDURAL
  Administered 2015-12-09: 5 mL via EPIDURAL
  Administered 2015-12-09: 6 mL via EPIDURAL
  Administered 2015-12-09: 5 mL via EPIDURAL

## 2015-12-09 MED ORDER — PHENYLEPHRINE 40 MCG/ML (10ML) SYRINGE FOR IV PUSH (FOR BLOOD PRESSURE SUPPORT)
80.0000 ug | PREFILLED_SYRINGE | INTRAVENOUS | Status: DC | PRN
  Filled 2015-12-09: qty 5

## 2015-12-09 MED ORDER — SOD CITRATE-CITRIC ACID 500-334 MG/5ML PO SOLN: 30.0000 mL | ORAL | Status: DC | PRN

## 2015-12-09 MED ORDER — LACTATED RINGERS IV SOLN
500.0000 mL | INTRAVENOUS | Status: DC | PRN
  Administered 2015-12-10: 500 mL via INTRAVENOUS

## 2015-12-09 MED ORDER — OXYTOCIN 40 UNITS IN LACTATED RINGERS INFUSION - SIMPLE MED
1.0000 m[IU]/min | INTRAVENOUS | Status: DC
  Administered 2015-12-09: 2 m[IU]/min via INTRAVENOUS

## 2015-12-09 MED ORDER — LIDOCAINE HCL (PF) 1 % IJ SOLN
30.0000 mL | INTRAMUSCULAR | Status: DC | PRN
  Administered 2015-12-10: 30 mL via SUBCUTANEOUS
  Filled 2015-12-09: qty 30

## 2015-12-09 MED ORDER — EPHEDRINE 5 MG/ML INJ: 10.0000 mg | INTRAVENOUS | Status: DC | PRN

## 2015-12-09 MED ORDER — ONDANSETRON HCL 4 MG/2ML IJ SOLN: 4.0000 mg | Freq: Four times a day (QID) | INTRAMUSCULAR | Status: DC | PRN

## 2015-12-09 MED ORDER — FENTANYL 2.5 MCG/ML BUPIVACAINE 1/10 % EPIDURAL INFUSION (WH - ANES)
14.0000 mL/h | INTRAMUSCULAR | Status: DC | PRN
  Administered 2015-12-09 (×3): 14 mL/h via EPIDURAL
  Filled 2015-12-09 (×3): qty 125

## 2015-12-09 MED ORDER — PHENYLEPHRINE 40 MCG/ML (10ML) SYRINGE FOR IV PUSH (FOR BLOOD PRESSURE SUPPORT)
80.0000 ug | PREFILLED_SYRINGE | INTRAVENOUS | Status: DC | PRN
  Filled 2015-12-09 (×2): qty 10
  Filled 2015-12-09: qty 5

## 2015-12-09 MED ORDER — TERBUTALINE SULFATE 1 MG/ML IJ SOLN: 0.2500 mg | Freq: Once | INTRAMUSCULAR | Status: DC | PRN

## 2015-12-09 MED ORDER — OXYTOCIN 40 UNITS IN LACTATED RINGERS INFUSION - SIMPLE MED
2.5000 [IU]/h | INTRAVENOUS | Status: DC
  Filled 2015-12-09: qty 1000

## 2015-12-09 MED ORDER — DIPHENHYDRAMINE HCL 50 MG/ML IJ SOLN: 12.5000 mg | INTRAMUSCULAR | Status: DC | PRN

## 2015-12-09 MED ORDER — PHENYLEPHRINE 40 MCG/ML (10ML) SYRINGE FOR IV PUSH (FOR BLOOD PRESSURE SUPPORT): 80.0000 ug | PREFILLED_SYRINGE | INTRAVENOUS | Status: DC | PRN

## 2015-12-09 MED ORDER — SIMETHICONE 80 MG PO CHEW
80.0000 mg | CHEWABLE_TABLET | Freq: Four times a day (QID) | ORAL | Status: DC | PRN
  Administered 2015-12-09: 80 mg via ORAL
  Filled 2015-12-09: qty 1

## 2015-12-09 MED ORDER — LACTATED RINGERS IV SOLN
INTRAVENOUS | Status: DC
  Administered 2015-12-09 (×2): via INTRAVENOUS

## 2015-12-09 MED ORDER — FLEET ENEMA 7-19 GM/118ML RE ENEM: 1.0000 | ENEMA | RECTAL | Status: DC | PRN

## 2015-12-09 MED ORDER — OXYTOCIN 40 UNITS IN LACTATED RINGERS INFUSION - SIMPLE MED: 1.0000 m[IU]/min | INTRAVENOUS | Status: DC

## 2015-12-09 MED ORDER — ACETAMINOPHEN 325 MG PO TABS
650.0000 mg | ORAL_TABLET | ORAL | Status: DC | PRN
  Administered 2015-12-10: 650 mg via ORAL
  Filled 2015-12-09: qty 2

## 2015-12-09 NOTE — Anesthesia Pain Management Evaluation Note (Signed)
  CRNA Pain Management Visit Note  Patient: [name], 30 y.o., female  "Hello I am a [facility] the anesthesia [facility]. We have an anesthesia team available at all times to provide care throughout the hospital, including epidural management and anesthesia for C-section. I don't know your plan for the delivery whether it a natural birth, water birth, IV sedation, nitrous supplementation, doula or epidural, but we want to meet your pain goals."   1.Was your pain managed to your expectations on prior hospitalizations?   Yes   2.What is your expectation for pain management during this hospitalization?     Epidural  3.How can we help you reach that goal? epidural  Record the patient's initial score and the patient's pain goal.   Pain: 4  Pain Goal: 2 [facility] wants you to be able to say your pain was always managed very well.  Berdene Askari 12/09/2015

## 2015-12-09 NOTE — Anesthesia Procedure Notes (Signed)
Epidural Patient location during procedure: OB Start time: 12/09/2015 7:45 AM End time: 12/09/2015 8:24 AM  Staffing Anesthesiologist: Jairo BenJACKSON, Moncia Annas Performed by: anesthesiologist   Preanesthetic Checklist Completed: patient identified, surgical consent, pre-op evaluation, timeout performed, IV checked, risks and benefits discussed and monitors and equipment checked  Epidural Patient position: sitting Prep: site prepped and draped and DuraPrep Patient monitoring: blood pressure, continuous pulse ox and heart rate Approach: midline Location: L2-L3 Injection technique: LOR air  Needle:  Needle type: Tuohy  Needle gauge: 17 G Needle length: 9 cm Needle insertion depth: 8 cm Catheter type: closed end flexible Catheter size: 19 Gauge Catheter at skin depth: 14 cm Test dose: negative (1% lidocaine)  Additional Notes LOR 8 cm Cath 14 cm skin Pt identified in Labor room.  Monitors applied. Working IV access confirmed. Sterile prep, drape lumbar spine.  1% lido local L 2,3.  #17ga Touhy LOR air at 8 cm L 2,3, cath in easily to 14 cm skin. Test dose OK, cath dosed and infusion begun.  Patient asymptomatic, VSS, no heme aspirated, tolerated well.  Jessica Craze Drusilla Wampole, MD Reason for block:procedure for pain

## 2015-12-09 NOTE — Progress Notes (Signed)
Patient ID: Jessica MccoyElizabeth F Schillaci, female   DOB: 11/06/1985, 30 y.o.   MRN: [id number]  D/W pt TOLAC, wishes to proceed.  Comfortable with epidural  AFVSS gen NAD FHTs 145-150, mod var, some early/occ late decels, category 1-2 toco q 5-8 min  SVE 2/80/-1, vtx  Continue current mgmt Poss AROM or pitocin to augment

## 2015-12-09 NOTE — H&P (Signed)
[name] is a 30 y.o. female G3P1011 at 39+ in early labor, desiring TOLAC.  Pt has been closed, in MAU noted to be 1 cm.  +FM, no LOF, no VB,ctx - regular and painful, every 5 min.  Prenatal care relatively uncomplicated.    Maternal Medical History:  Reason for admission: Contractions.   Contractions: Frequency: regular.    Fetal activity: Perceived fetal activity is normal.    Prenatal Complications - Diabetes: none.    OB History    Gravida Para Term Preterm AB TAB SAB Ectopic Multiple Living   G1 SAB G2 3/11 8#2, female, FTP  H/o abn pap as teen - last WNL + STD - HSV, Chl, trich  Past Medical History  Diagnosis Date  . No pertinent past medical history   . Anxiety   . Chlamydia   . Trichomonas   . Herpes   . Slipped cervical disc   . Superficial thrombophlebitis of right leg 2015  . Eczema   . Hx of varicella   . Depression     2007   Past Surgical History  Procedure Laterality Date  . Cesarean section    . Wisdom tooth extraction     Family History: family history is negative for Other. She was adopted. Social History:  reports that she quit smoking about 7 months ago. Her smoking use included Cigarettes. She has a 15 pack-year smoking history. She has never used smokeless tobacco. She reports that she does not drink alcohol or use illicit drugs. Meds PV, Valtrex All Amox, Flagyl, Clinda  Prenatal Transfer Tool  Maternal Diabetes: No Genetic Screening: Normal Maternal Ultrasounds/Referrals: Normal Fetal Ultrasounds or other Referrals:  None Maternal Substance Abuse:  No Significant Maternal Medications:  None Significant Maternal Lab Results:  Lab values include: Group B Strep negative Other Comments:  None  Review of Systems  Constitutional: Negative.   HENT: Negative.   Eyes: Negative.   Respiratory: Negative.   Cardiovascular: Negative.   Gastrointestinal: Negative.   Genitourinary: Negative.   Musculoskeletal:  Negative.   Skin: Negative.   Neurological: Negative.   Psychiatric/Behavioral: Negative.     Dilation: 1 Effacement (%): 80 Station: -3 Exam by:: Jessica Burns, RN BSN Blood pressure 125/78, pulse 89, temperature 98.3 F (36.8 C), temperature source Oral, resp. rate 18, height  (1.651 m), weight 94.403 kg (208 lb 1.9 oz), last menstrual period 02/27/2015. Maternal Exam:  Uterine Assessment: Contraction frequency is regular.   Abdomen: Surgical scars: low transverse.   Fundal height is appropriate for gestation.   Fetal presentation: vertex  Introitus: Normal vulva. Normal vagina.  Pelvis: questionable for delivery.   Cervix: Cervix evaluated by digital exam.     Physical Exam  Constitutional: She is oriented to person, place, and time. She appears well-developed and well-nourished.  HENT:  Head: Normocephalic and atraumatic.  Cardiovascular: Normal rate and regular rhythm.   Respiratory: Effort normal. No respiratory distress. She has no wheezes.  GI: Soft. Bowel sounds are normal. She exhibits no distension. There is no tenderness.  Musculoskeletal: Normal range of motion.  Neurological: She is alert and oriented to person, place, and time.  Skin: Skin is warm and dry.  Psychiatric: She has a normal mood and affect. Her behavior is normal.    Prenatal labs: ABO, Rh: O/Positive/-- (12/30 0000) Antibody: Negative (12/30 0000) Rubella: Immune (12/30 0000) RPR: Nonreactive (12/30 0000)  HBsAg: Negative (12/30  0000)  HIV: Non-reactive (12/30 0000)  GBS: Negative (06/14 0000)   Hgb 12.4/Plt 273/Ur Cx neg/GC neg/Chl neg/Pap WNL/First trimester Scr WNL/glucola 80  [facility]  US nl anat, post plac, female  Assessment/Plan: 30yo G3P1011 at 39+ in latent labor D/w pt r/b/a of TOLAC GBBS neg Possible induction with pitocin   [name], [name] 12/09/2015, 7:45 AM

## 2015-12-09 NOTE — Progress Notes (Signed)
Patient ID: [name], female   DOB: 12/20/1985, 30 y.o.   MRN: [id number]  Comfortable with epidural, some gas pressure  AFVSS gen NAD FHTs 150, mod var, category 1 toco Q 442min-4min  SVE 7/90/0-+1  Continue current mgmt, expect SVD

## 2015-12-09 NOTE — MAU Note (Signed)
Contractions since sve on Friday. Cervix closed Friday. For repeat C/s Thurs. Denies LOF or bleeding

## 2015-12-09 NOTE — Anesthesia Preprocedure Evaluation (Addendum)
Anesthesia Evaluation  Patient identified by MRN, date of birth, ID band Patient awake    Reviewed: Allergy & Precautions, NPO status , Patient's Chart, lab work & pertinent test results  History of Anesthesia Complications Negative for: history of anesthetic complications  Airway Mallampati: III  TM Distance: >3 FB Neck ROM: Full    Dental  (+) Dental Advisory Given   Pulmonary former smoker (quit 2016),    breath sounds clear to auscultation       Cardiovascular negative cardio ROS   Rhythm:Regular Rate:Normal     Neuro/Psych negative neurological ROS     GI/Hepatic Neg liver ROS, GERD  Poorly Controlled,  Endo/Other  negative endocrine ROS  Renal/GU negative Renal ROS     Musculoskeletal   Abdominal (+) + obese,   Peds  Hematology negative hematology ROS (+) plt 244k   Anesthesia Other Findings   Reproductive/Obstetrics (+) Pregnancy                            Anesthesia Physical Anesthesia Plan  ASA: II  Anesthesia Plan: Epidural   Post-op Pain Management:    Induction:   Airway Management Planned:   Additional Equipment:   Intra-op Plan:   Post-operative Plan:   Informed Consent: I have reviewed the patients History and Physical, chart, labs and discussed the procedure including the risks, benefits and alternatives for the proposed anesthesia with the patient or authorized representative who has indicated his/her understanding and acceptance.   Dental advisory given  Plan Discussed with:   Anesthesia Plan Comments: (Patient identified. Risks/Benefits/Options discussed with patient including but not limited to bleeding, infection, nerve damage, paralysis, failed block, incomplete pain control, headache, blood pressure changes, nausea, vomiting, reactions to medication both or allergic, itching and postpartum back pain. Confirmed with bedside nurse the patient's most  recent platelet count. Confirmed with patient that they are not currently taking any anticoagulation, have any bleeding history or any family history of bleeding disorders. Patient expressed understanding and wished to proceed. All questions were answered.  )        Anesthesia Quick Evaluation

## 2015-12-09 NOTE — Progress Notes (Signed)
Patient ID: [name], female   DOB: 10/02/1985, 30 y.o.   MRN: [id number]  Late entry  Some pressure with ctx  AFVSS gen NAD FHTs 140-150, mod var, category 1 toco q 2-494min mvu 150-180  SVE 5/100/-1  Continue IOL of pitocin Monitor closely

## 2015-12-10 ENCOUNTER — Encounter (HOSPITAL_COMMUNITY): Payer: Self-pay | Admitting: Obstetrics and Gynecology

## 2015-12-10 DIAGNOSIS — O34219 Maternal care for unspecified type scar from previous cesarean delivery: Secondary | ICD-10-CM

## 2015-12-10 LAB — CBC
HCT: 27 % — ABNORMAL LOW (ref 36.0–46.0)
Hemoglobin: 9.1 g/dL — ABNORMAL LOW (ref 12.0–15.0)
MCH: 30.5 pg (ref 26.0–34.0)
MCHC: 33.7 g/dL (ref 30.0–36.0)
MCV: 90.6 fL (ref 78.0–100.0)
PLATELETS: 217 10*3/uL (ref 150–400)
RBC: 2.98 MIL/uL — ABNORMAL LOW (ref 3.87–5.11)
RDW: 14 % (ref 11.5–15.5)
WBC: 19.1 10*3/uL — AB (ref 4.0–10.5)

## 2015-12-10 MED ORDER — DIBUCAINE 1 % RE OINT: 1.0000 "application " | TOPICAL_OINTMENT | RECTAL | Status: DC | PRN

## 2015-12-10 MED ORDER — MEDROXYPROGESTERONE ACETATE 150 MG/ML IM SUSP
150.0000 mg | INTRAMUSCULAR | Status: AC | PRN
  Administered 2015-12-12: 150 mg via INTRAMUSCULAR
  Filled 2015-12-10: qty 1

## 2015-12-10 MED ORDER — BENZOCAINE-MENTHOL 20-0.5 % EX AERO
1.0000 | INHALATION_SPRAY | CUTANEOUS | Status: DC | PRN
Start: 2015-12-10 — End: 2015-12-12
  Filled 2015-12-10 (×2): qty 56

## 2015-12-10 MED ORDER — SIMETHICONE 80 MG PO CHEW: 80.0000 mg | CHEWABLE_TABLET | ORAL | Status: DC | PRN

## 2015-12-10 MED ORDER — FAMOTIDINE 20 MG PO TABS
20.0000 mg | ORAL_TABLET | Freq: Every day | ORAL | Status: DC
  Filled 2015-12-10 (×2): qty 1

## 2015-12-10 MED ORDER — DIPHENHYDRAMINE HCL 25 MG PO CAPS: 25.0000 mg | ORAL_CAPSULE | Freq: Four times a day (QID) | ORAL | Status: DC | PRN

## 2015-12-10 MED ORDER — ZOLPIDEM TARTRATE 5 MG PO TABS: 5.0000 mg | ORAL_TABLET | Freq: Every evening | ORAL | Status: DC | PRN

## 2015-12-10 MED ORDER — COCONUT OIL OIL: 1.0000 "application " | TOPICAL_OIL | Status: DC | PRN

## 2015-12-10 MED ORDER — OXYCODONE HCL 5 MG PO TABS: 5.0000 mg | ORAL_TABLET | ORAL | Status: DC | PRN

## 2015-12-10 MED ORDER — WITCH HAZEL-GLYCERIN EX PADS: 1.0000 "application " | MEDICATED_PAD | CUTANEOUS | Status: DC | PRN

## 2015-12-10 MED ORDER — SENNOSIDES-DOCUSATE SODIUM 8.6-50 MG PO TABS
2.0000 | ORAL_TABLET | ORAL | Status: DC
  Administered 2015-12-11 (×2): 2 via ORAL
  Filled 2015-12-10 (×2): qty 2

## 2015-12-10 MED ORDER — IBUPROFEN 600 MG PO TABS
600.0000 mg | ORAL_TABLET | Freq: Four times a day (QID) | ORAL | Status: DC
  Administered 2015-12-10 – 2015-12-12 (×10): 600 mg via ORAL
  Filled 2015-12-10 (×10): qty 1

## 2015-12-10 MED ORDER — ONDANSETRON HCL 4 MG PO TABS: 4.0000 mg | ORAL_TABLET | ORAL | Status: DC | PRN

## 2015-12-10 MED ORDER — LACTATED RINGERS IV SOLN: INTRAVENOUS | Status: DC

## 2015-12-10 MED ORDER — OXYCODONE HCL 5 MG PO TABS: 10.0000 mg | ORAL_TABLET | ORAL | Status: DC | PRN

## 2015-12-10 MED ORDER — PRENATAL MULTIVITAMIN CH
1.0000 | ORAL_TABLET | Freq: Every day | ORAL | Status: DC
  Administered 2015-12-10 – 2015-12-12 (×3): 1 via ORAL
  Filled 2015-12-10 (×3): qty 1

## 2015-12-10 MED ORDER — ONDANSETRON HCL 4 MG/2ML IJ SOLN: 4.0000 mg | INTRAMUSCULAR | Status: DC | PRN

## 2015-12-10 MED ORDER — ACETAMINOPHEN 325 MG PO TABS: 650.0000 mg | ORAL_TABLET | ORAL | Status: DC | PRN

## 2015-12-10 NOTE — Anesthesia Postprocedure Evaluation (Signed)
Anesthesia Post Note  Patient: [name]  Procedure(s) Performed: * No procedures listed *  Patient location during evaluation: Mother Baby Anesthesia Type: Epidural Level of consciousness: awake and alert and oriented Pain management: satisfactory to patient Vital Signs Assessment: post-procedure vital signs reviewed and stable Respiratory status: spontaneous breathing and nonlabored ventilation Cardiovascular status: stable Postop Assessment: no headache, no backache, no signs of nausea or vomiting, adequate PO intake and patient able to bend at knees (patient up walking) Anesthetic complications: no     Last Vitals:  Filed Vitals:   12/10/15 0600 12/10/15 1001  BP: 110/60 104/44  Pulse: 101 95  Temp: 37.1 C 36.8 C  Resp: 18 18    Last Pain:  Filed Vitals:   12/10/15 1123  PainSc: 3    Pain Goal: Patients Stated Pain Goal: 0 (12/10/15 0600)               Madison HickmanGREGORY,Sonja Manseau

## 2015-12-10 NOTE — Progress Notes (Signed)
Post Partum Day 0 Subjective: no complaints, up ad lib, voiding, tolerating PO and nl lochia, pain controlled  Objective: Blood pressure 104/44, pulse 95, temperature 98.3 F (36.8 C), temperature source Oral, resp. rate 18, height 5\' 5"  (1.651 m), weight 94.348 kg (208 lb), last menstrual period 02/27/2015, SpO2 100 %, unknown if currently breastfeeding.  Physical Exam:  General: alert and no distress Lochia: appropriate Uterine Fundus: firm   Recent Labs  12/09/15 0640 12/10/15 0601  HGB 11.2* 9.1*  HCT 32.7* 27.0*    Assessment/Plan: Plan for discharge tomorrow, Breastfeeding and Lactation consult.  Routine care.     LOS: 1 day   [name], Tauri Ethington 12/10/2015, 10:09 AM

## 2015-12-10 NOTE — Lactation Note (Signed)
This note was copied from a baby's chart. Lactation Consultation Note  P2, Ex BF for 6 months. Baby 8 hours old. Reviewed hand expression w/ mother and how to prepump R nipple to help w/ latching, Mother latched baby in cradle hold.  Discussed making sure baby stays deep on breast and how to unlatch. Mom encouraged to feed baby 8-12 times/24 hours and with feeding cues.  Mom made aware of O/P services, breastfeeding support groups, community resources, and our phone # for post-discharge questions.    Patient Name: [name] GEXBM'WToday's Date: 12/10/2015 Reason for consult: Initial assessment   Maternal Data Has patient been taught Hand Expression?: Yes Does the patient have breastfeeding experience prior to this delivery?: Yes  Feeding Feeding Type: Breast Fed  LATCH Score/Interventions Latch: Grasps breast easily, tongue down, lips flanged, rhythmical sucking.  Audible Swallowing: A few with stimulation  Type of Nipple: Everted at rest and after stimulation  Comfort (Breast/Nipple): Soft / non-tender     Hold (Positioning): Assistance needed to correctly position infant at breast and maintain latch.  LATCH Score: 8  Lactation Tools Discussed/Used     Consult Status Consult Status: Follow-up Date: 12/11/15 Follow-up type: In-patient    Dahlia ByesBerkelhammer, Ruth [facility] 12/10/2015, 11:15 AM

## 2015-12-10 NOTE — Progress Notes (Signed)
Patient assisted out of bed to bathroom and voided large amount in toilet. Instructed in peri care, then assisted back to bed with steady gait. Condition stable. Lochia small to moderate rubra.

## 2015-12-10 NOTE — Progress Notes (Signed)
[facility] accompanied by RN and Significant other with baby girl in mother's arms. Patient able to ambulate to bed with steady gait and no dizziness. Initial vitals and assessment WNL. Fundus is firm and at the umbilicus with moderate rubra lochia. Oriented to room, call light, visiting hours, paperwork, etc... Pain scale is 3 at this time. Condition stable.

## 2015-12-10 NOTE — Progress Notes (Signed)
Patient requesting time to discuss relationship issues with father of baby who arrived in past 30 minutes prior to initiating pushing.  Patient tearful at this time as FOB threatening to leave.  Nurse has given patient and FOB time together and will reassess soon.

## 2015-12-10 NOTE — Progress Notes (Signed)
CSW received referral for re: pt's hx of anxiety.  Per pt, [facility]" with anxiety, but takes no meds for such, nor does she receive therapy.  Pt denies any s/s of anxiety at this time.  PPD depression discussed.  Pt agreeable to f/u with her MD re: changes in mood or behavior, post d/c.  No other social work needs identiifed, CSW signing off.  Please re-consult as necessary.  Pollyann SavoyJody Hoby Kawai, LCSW Weekend Coverage [phone]

## 2015-12-11 NOTE — Lactation Note (Signed)
This note was copied from a baby's chart. Lactation Consultation Note  Patient Name: [name] KGMWN'UToday's Date: 12/11/2015 Reason for consult: Follow-up assessment Baby at 38 hr of life. Experienced bf mom reports feedings are going well. She denies  Breast or nipple pain, voiced no concerns. Discussed baby behavior, feeding frequency, voids, wt loss, breast changes, and nipple care. She can manually express and has spoon spoon in room. She is aware of lactation services and support group. [facility] She will call lactation as needed.     Maternal Data    Feeding Feeding Type: Breast Fed Length of feed: 20 min  LATCH Score/Interventions Latch: Grasps breast easily, tongue down, lips flanged, rhythmical sucking.  Audible Swallowing: A few with stimulation Intervention(s): Skin to skin;Hand expression Intervention(s): Alternate breast massage  Type of Nipple: Everted at rest and after stimulation  Comfort (Breast/Nipple): Soft / non-tender     Hold (Positioning): No assistance needed to correctly position infant at breast. Intervention(s): Support Pillows  LATCH Score: 9  Lactation Tools Discussed/Used WIC Program: Yes   Consult Status Consult Status: PRN    [name] 12/11/2015, 5:05 PM

## 2015-12-11 NOTE — Progress Notes (Signed)
UR chart review completed.  

## 2015-12-11 NOTE — Progress Notes (Signed)
PPD #1 Doing well, sore Afeb, VSS Fundus firm Continue routine postpartum care

## 2015-12-12 MED ORDER — IBUPROFEN 600 MG PO TABS: 600.0000 mg | ORAL_TABLET | Freq: Four times a day (QID) | ORAL | Status: DC

## 2015-12-12 NOTE — Lactation Note (Signed)
This note was copied from a baby's chart. Lactation Consultation Note  Patient Name: Girl Ma Hillocklizabeth Andreason ZOXWR'UToday's Date: 12/12/2015  Follow up visit made prior to discharge.  Mom states baby is breastfeeding well.  No questions at present.  Encouraged to keep baby effective by using good waking techniques and breast massage.  Lactation outpatient services and support reviewed and encouraged.   Maternal Data    Feeding    LATCH Score/Interventions                      Lactation Tools Discussed/Used     Consult Status      Huston FoleyMOULDEN, Gricelda Foland S 12/12/2015, 10:18 AM

## 2015-12-12 NOTE — Progress Notes (Signed)
Post Partum Day 2 Subjective: up ad lib, voiding, tolerating PO, + flatus and pain controlled with meds. A little uncomfortable due to perineal stitches as expected. Bonding wellw ith baby. Breastfeeding. Ready for discharge to home today  Objective: Blood pressure 108/64, pulse 85, temperature 98.6 F (37 C), temperature source Oral, resp. rate 18, height 5\' 5"  (1.651 m), weight 208 lb (94.348 kg), last menstrual period 02/27/2015, SpO2 100 %, unknown if currently breastfeeding.  Physical Exam:  General: alert, cooperative and no distress Lochia: appropriate Uterine Fundus: firm Incision: n/a DVT Evaluation: No evidence of DVT seen on physical exam. No significant calf/ankle edema.   Recent Labs  12/10/15 0601  HGB 9.1*  HCT 27.0*    Assessment/Plan: Discharge home, Breastfeeding and Contraception depo provera prior to discharge home   LOS: 3 days   Edwinna AreolaCecilia Worema Askia Hazelip 12/12/2015, 9:16 AM

## 2015-12-12 NOTE — Discharge Instructions (Signed)
Nothing in vagina for 6 weeks.  No sex, tampons, and douching.  Other instructions as in [facility] Discharge Booklet. °

## 2015-12-12 NOTE — Discharge Summary (Signed)
OB Discharge Summary     Patient Name: [name] DOB: 06/11/1985 MRN: [id number]  Date of admission: 12/09/2015 Delivering MD: Sherian ReinBOVARD-STUCKERT, JODY   Date of discharge: 12/12/2015  Admitting diagnosis: 40 weeks, severe pain, contractions, throwing up Intrauterine pregnancy: 7051w5d     Secondary diagnosis:  Principal Problem:   VBAC, delivered, current hospitalization Active Problems:   Active labor at term  Additional problems: none     Discharge diagnosis: Term Pregnancy Delivered and VBAC                                                                                                Post partum procedures:none  Augmentation: Pitocin  Complications: None  Hospital course:  Onset of Labor With Vaginal Delivery     30 y.o. yo G3P2011 at 6751w5d was admitted in Latent Labor on 12/09/2015. Patient had an uncomplicated labor course as follows:  Membrane Rupture Time/Date: 2:32 PM ,12/09/2015   Intrapartum Procedures: Episiotomy: None [1]                                         Lacerations:  Labial [10];Vaginal [6]  Patient had a delivery of a Viable infant. 12/10/2015  Information for the patient's newborn:  Belva CromeScott, Girl Gabrille [[id number]][[id number]]  Delivery Method: Vaginal, Spontaneous Delivery (Filed from Delivery Summary)    Pateint had an uncomplicated postpartum course.  She is ambulating, tolerating a regular diet, passing flatus, and urinating well. Patient is discharged home in stable condition on 12/12/2015.    Physical exam  Filed Vitals:   12/10/15 1713 12/10/15 1745 12/11/15 0510 12/12/15 0635  BP: 113/56 109/57 107/50 108/64  Pulse: 96 97 82 85  Temp: 98.3 F (36.8 C) 97.8 F (36.6 C) 98.4 F (36.9 C) 98.6 F (37 C)  TempSrc: Oral Oral Oral Oral  Resp: 18 18 18 18   Height:      Weight:      SpO2:       General: alert, cooperative and no distress Lochia: appropriate Uterine Fundus: firm Incision: N/A DVT Evaluation: No evidence of DVT seen on physical  exam. No significant calf/ankle edema. Labs: Lab Results  Component Value Date   WBC 19.1* 12/10/2015   HGB 9.1* 12/10/2015   HCT 27.0* 12/10/2015   MCV 90.6 12/10/2015   PLT 217 12/10/2015   CMP Latest Ref Rng 11/13/2013  Glucose 70 - 99 mg/dL 87  BUN 6 - 23 mg/dL 9  Creatinine 7.820.50 - 9.561.10 mg/dL 2.130.84  Sodium 086137 - 578147 mEq/L 139  Potassium 3.7 - 5.3 mEq/L 4.5  Chloride 96 - 112 mEq/L 102  CO2 19 - 32 mEq/L 24  Calcium 8.4 - 10.5 mg/dL 9.3  Total Protein 6.0 - 8.3 g/dL 6.7  Total Bilirubin 0.3 - 1.2 mg/dL 4.6(N0.2(L)  Alkaline Phos 39 - 117 U/L 46  AST 0 - 37 U/L 11  ALT 0 - 35 U/L 9    Discharge instruction: per After Visit Summary and "Baby and Me Booklet".  After visit meds:    Medication List    TAKE these medications        CONCEPT OB 130-92.4-1 MG Caps  Take 1 capsule by mouth daily.     ibuprofen 600 MG tablet  Commonly known as:  ADVIL,MOTRIN  Take 1 tablet (600 mg total) by mouth every 6 (six) hours.     ranitidine 150 MG tablet  Commonly known as:  ZANTAC  Take 150 mg by mouth 2 (two) times daily as needed for heartburn.     valACYclovir 500 MG tablet  Commonly known as:  VALTREX  Take 500 mg by mouth 2 (two) times daily.        Diet: routine diet  Activity: Advance as tolerated. Pelvic rest for 6 weeks.   Outpatient follow up:6 weeks Follow up Appt:No future appointments. Follow up Visit:No Follow-up on file.  Postpartum contraception: Depo Provera  Newborn Data: Live born female  Birth Weight: 6 lb 10.7 oz (3025 g) APGAR: 8, 9  Baby Feeding: Breast Disposition:home with mother   12/12/2015 Edwinna Areola, DO

## 2015-12-13 ENCOUNTER — Encounter (HOSPITAL_COMMUNITY)
Admission: RE | Admit: 2015-12-13 | Discharge: 2015-12-13 | Disposition: A | Discharge status: Home / Self Care | Payer: Medicaid Other | Source: Ambulatory Visit

## 2015-12-13 HISTORY — DX: Dermatitis, unspecified: L30.9

## 2015-12-13 HISTORY — DX: Major depressive disorder, single episode, unspecified: F32.9

## 2015-12-13 HISTORY — DX: Personal history of other infectious and parasitic diseases: Z86.19

## 2015-12-13 HISTORY — DX: Depression, unspecified: F32.A

## 2015-12-14 ENCOUNTER — Encounter (HOSPITAL_COMMUNITY): Admission: RE | Payer: Self-pay | Source: Ambulatory Visit

## 2015-12-14 ENCOUNTER — Inpatient Hospital Stay (HOSPITAL_COMMUNITY)
Admission: RE | Admit: 2015-12-14 | Payer: Medicaid Other | Source: Ambulatory Visit | Admitting: Obstetrics and Gynecology

## 2015-12-14 SURGERY — Surgical Case
Anesthesia: Regional

## 2016-01-19 IMAGING — CR DG CHEST 2V
2 series · 2 of 2 positions shown · non-contrast
Comparison: None.

CLINICAL DATA: Productive cough

EXAM:
CHEST  2 VIEW

[w chest pa]
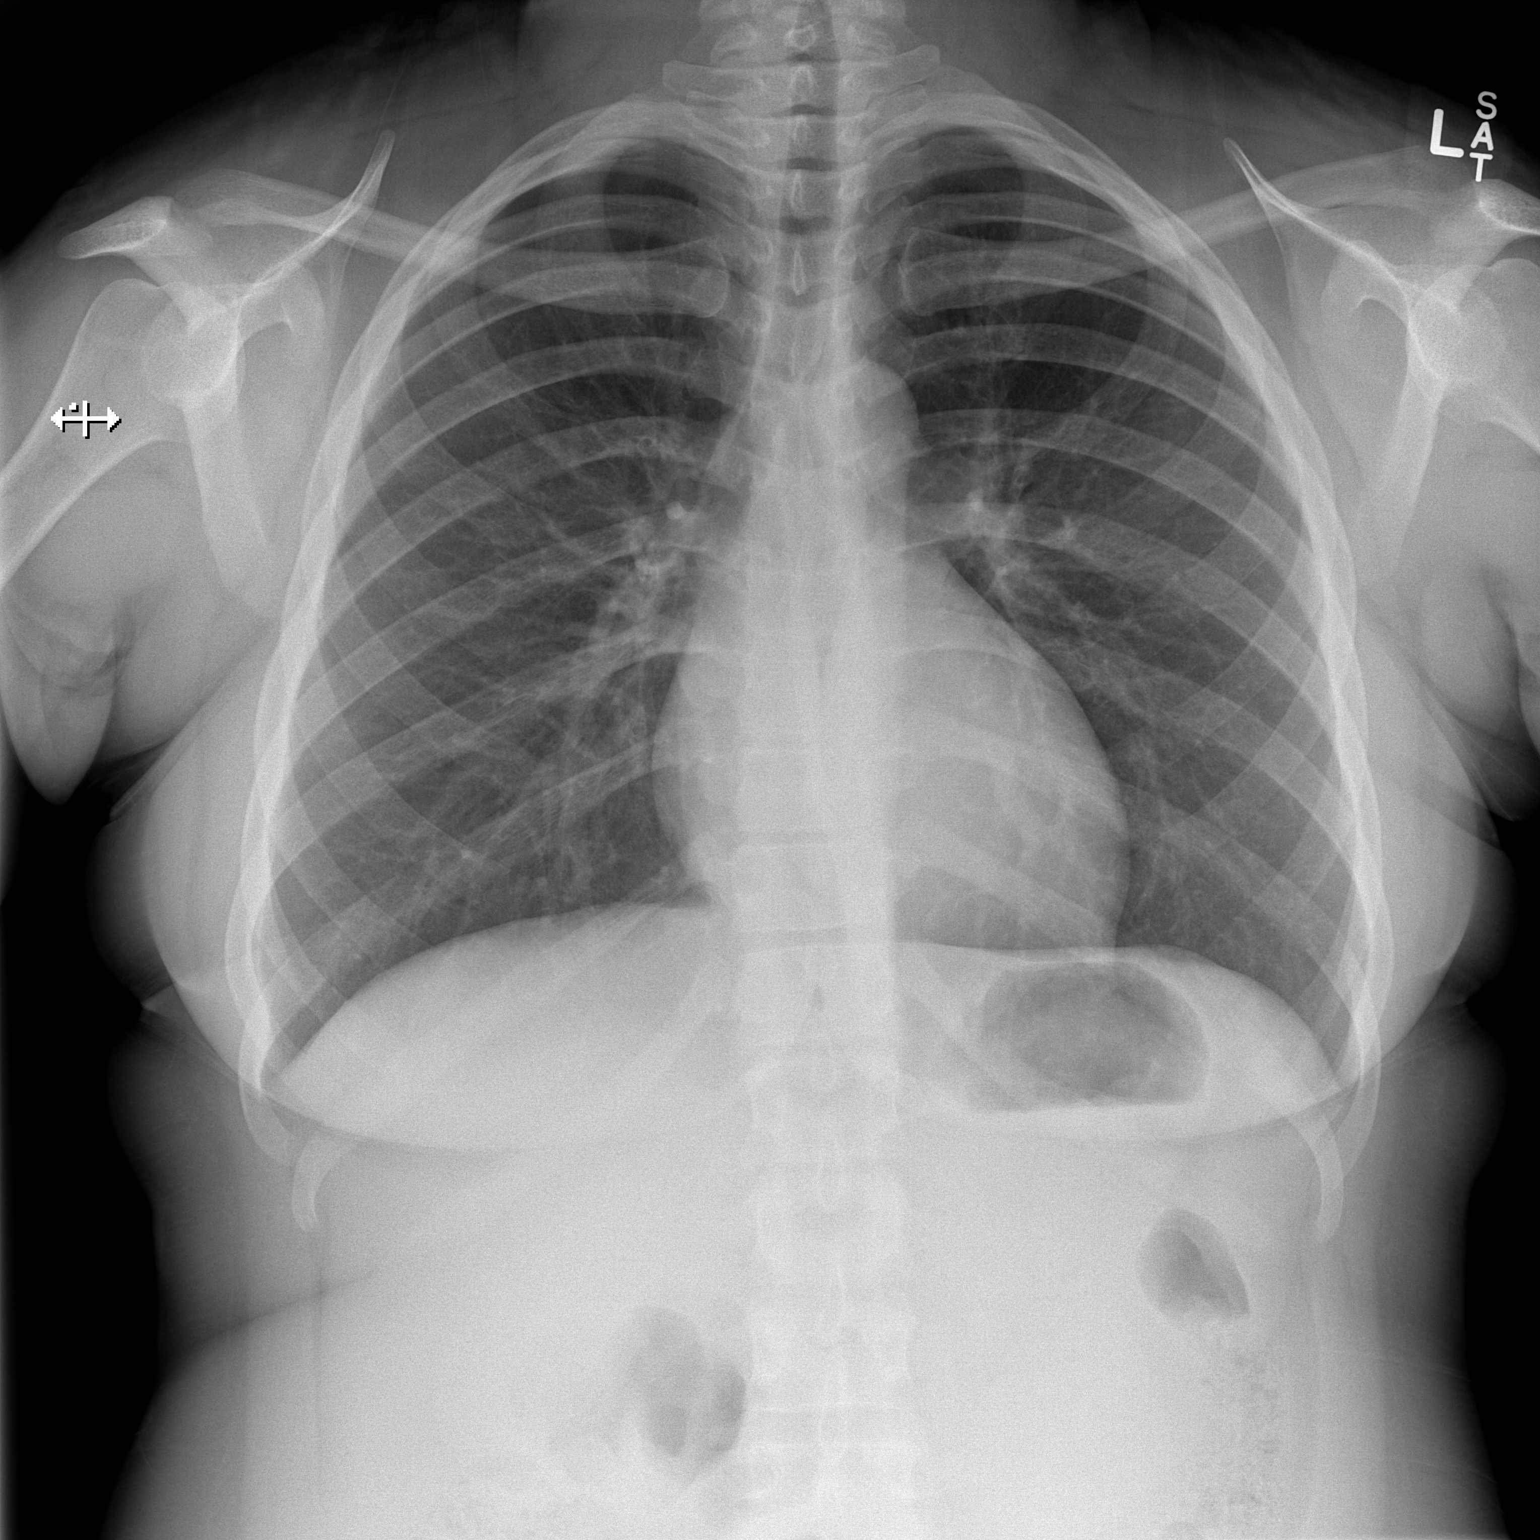

[w chest lat]
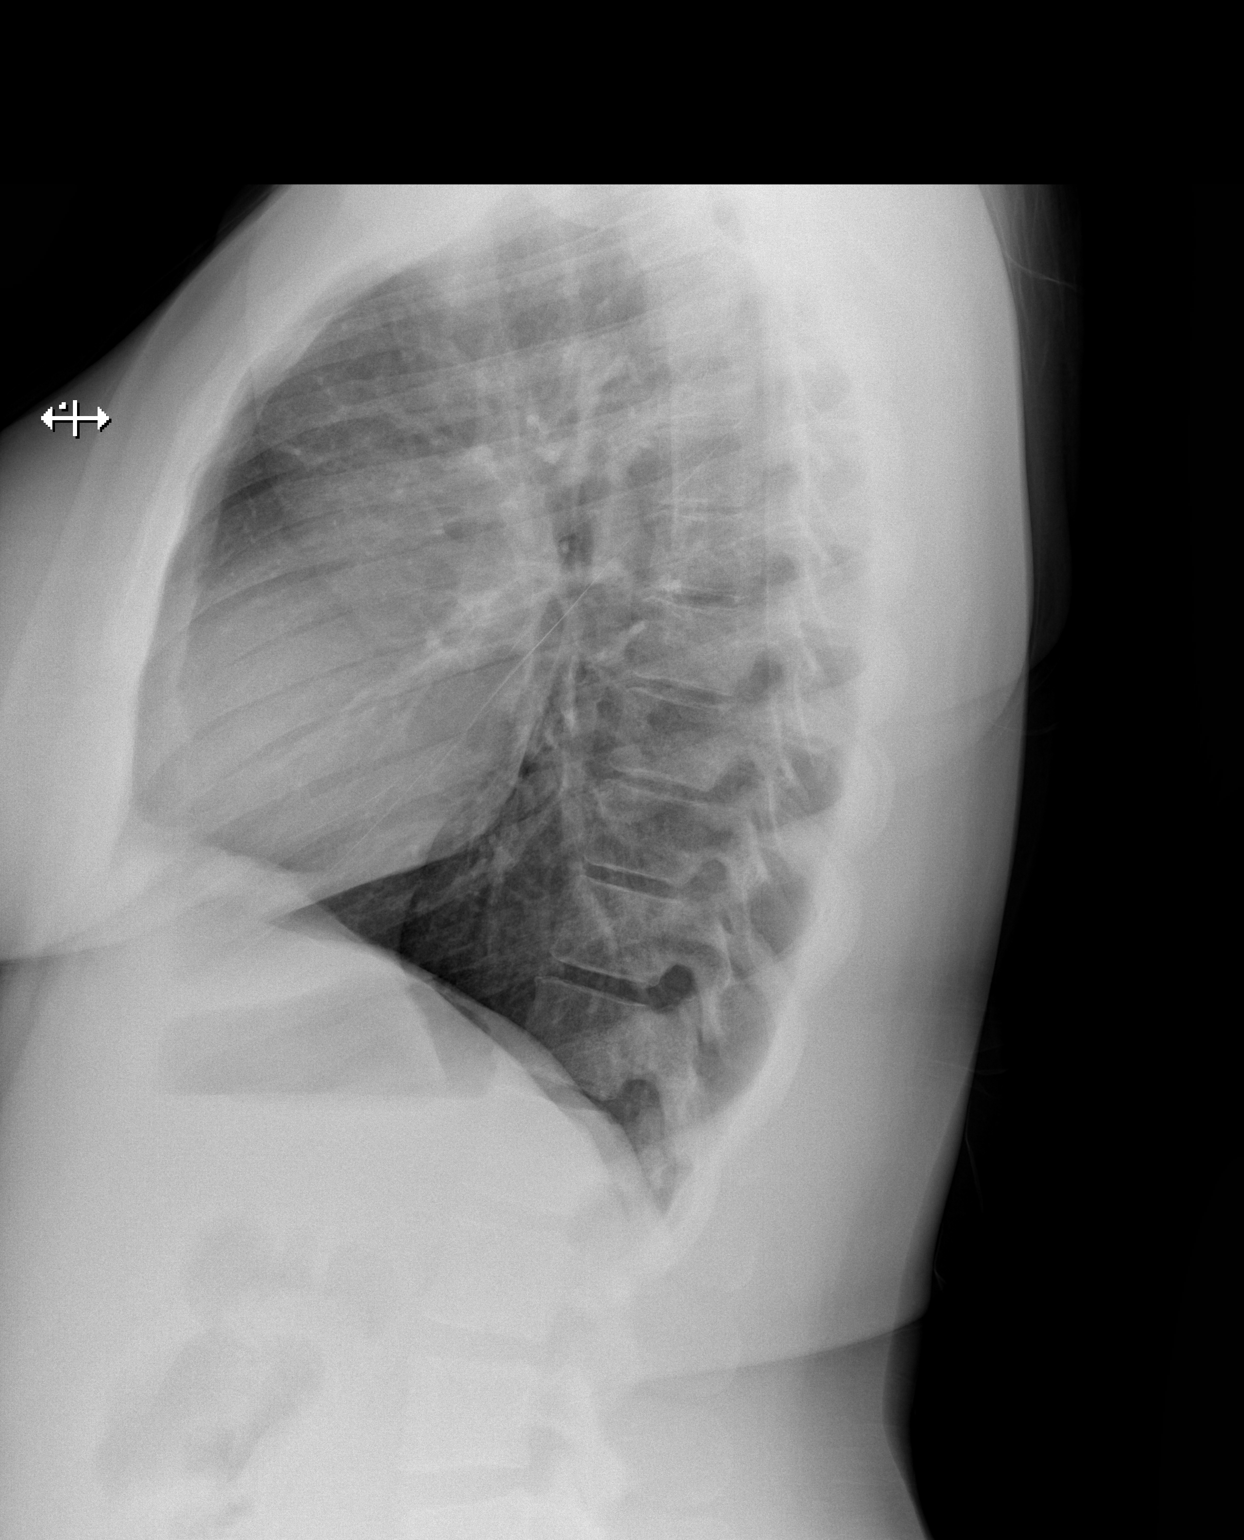

[2 of 2 positions shown; findings below may reference images not displayed]

FINDINGS: The heart size and mediastinal contours are within normal limits.
Both lungs are clear. The visualized skeletal structures are
unremarkable.
IMPRESSION: No active cardiopulmonary disease.

## 2016-10-14 DIAGNOSIS — G44209 Tension-type headache, unspecified, not intractable: Secondary | ICD-10-CM | POA: Diagnosis not present

## 2016-10-14 DIAGNOSIS — H40033 Anatomical narrow angle, bilateral: Secondary | ICD-10-CM | POA: Diagnosis not present

## 2017-02-19 IMAGING — CR DG CHEST 2V
2 series · 2 of 2 positions shown · non-contrast
Comparison: 08/18/2013

CLINICAL DATA: Cough. Congestion. Nasal mucosal swelling for 2
days.

EXAM:
CHEST  2 VIEW

[w chest pa]
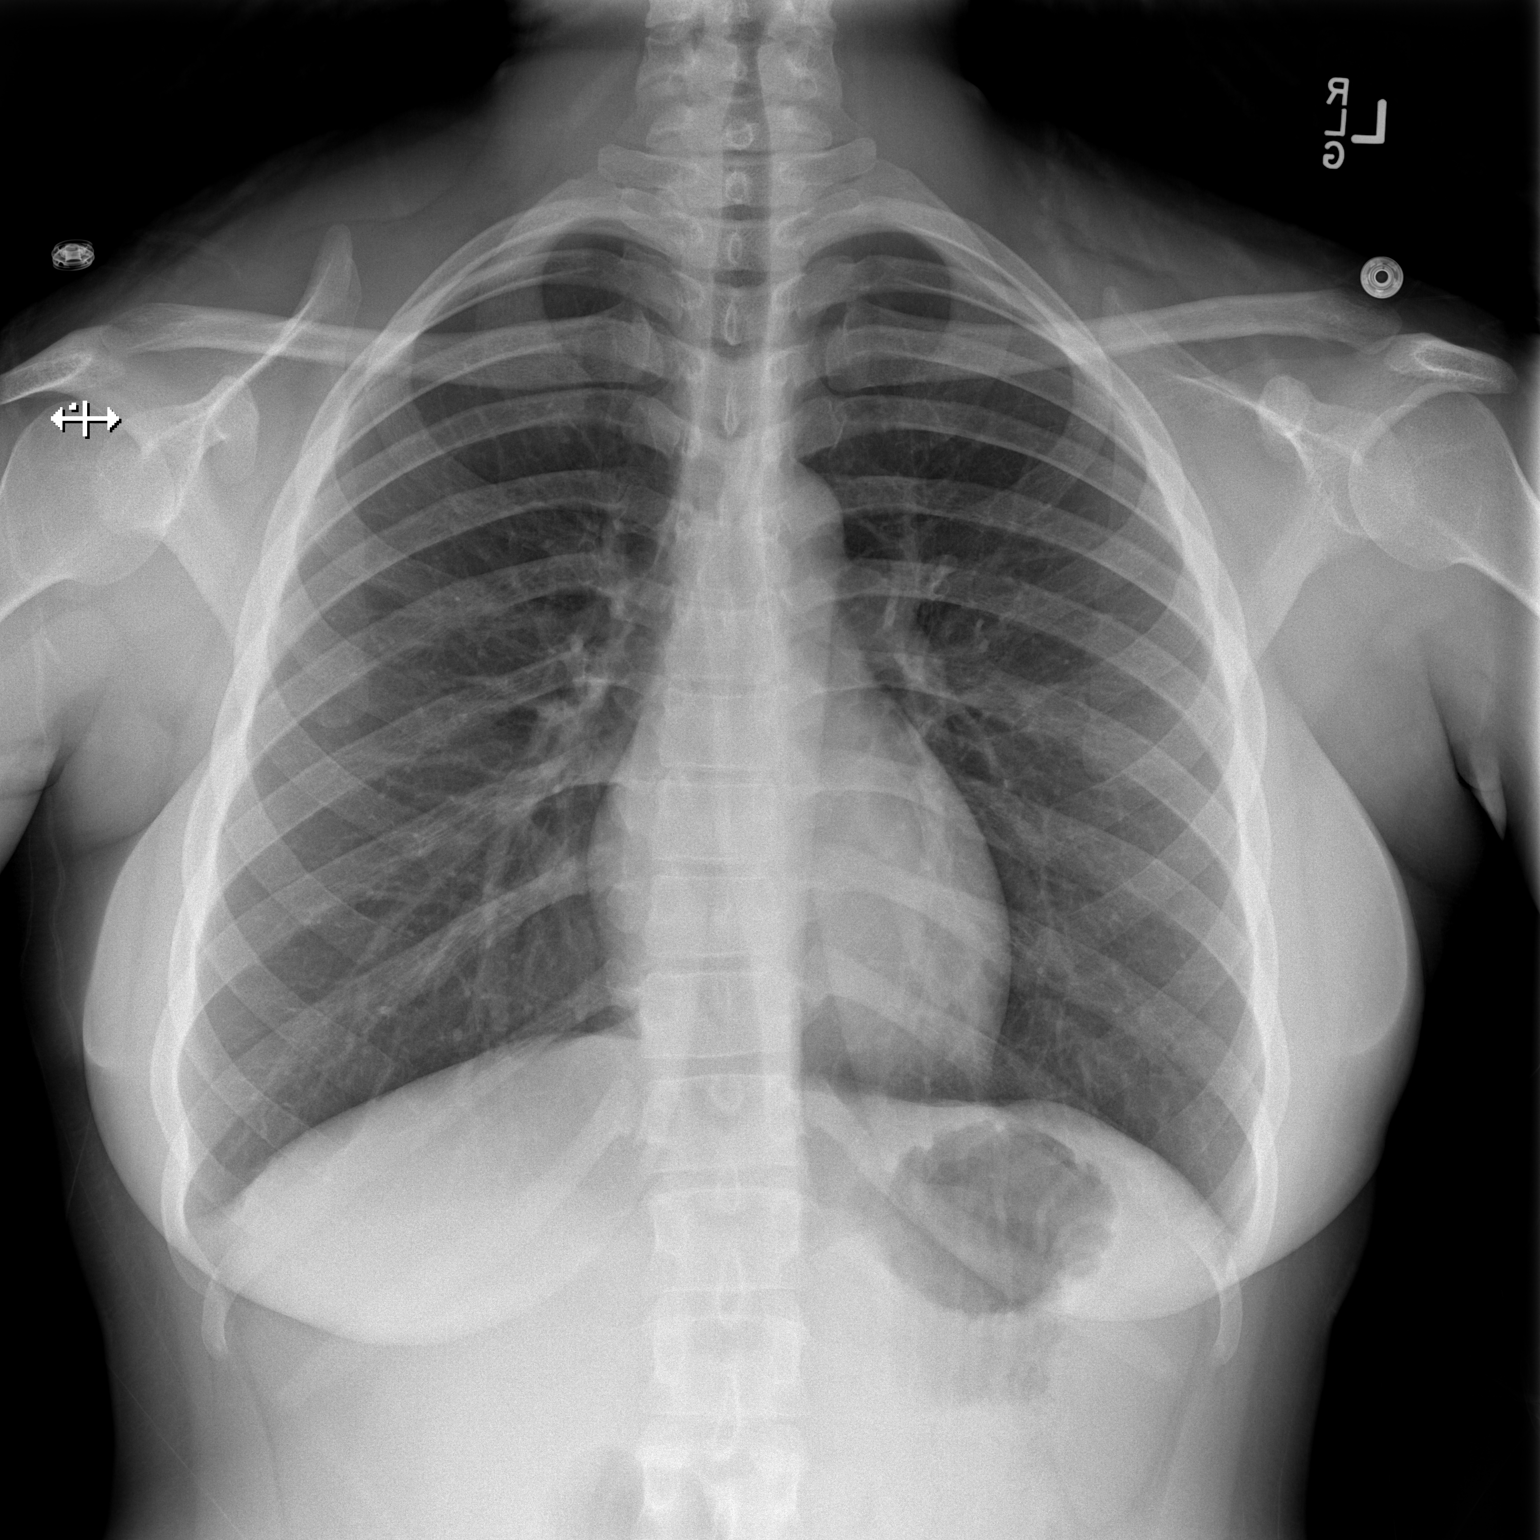

[w chest lat]
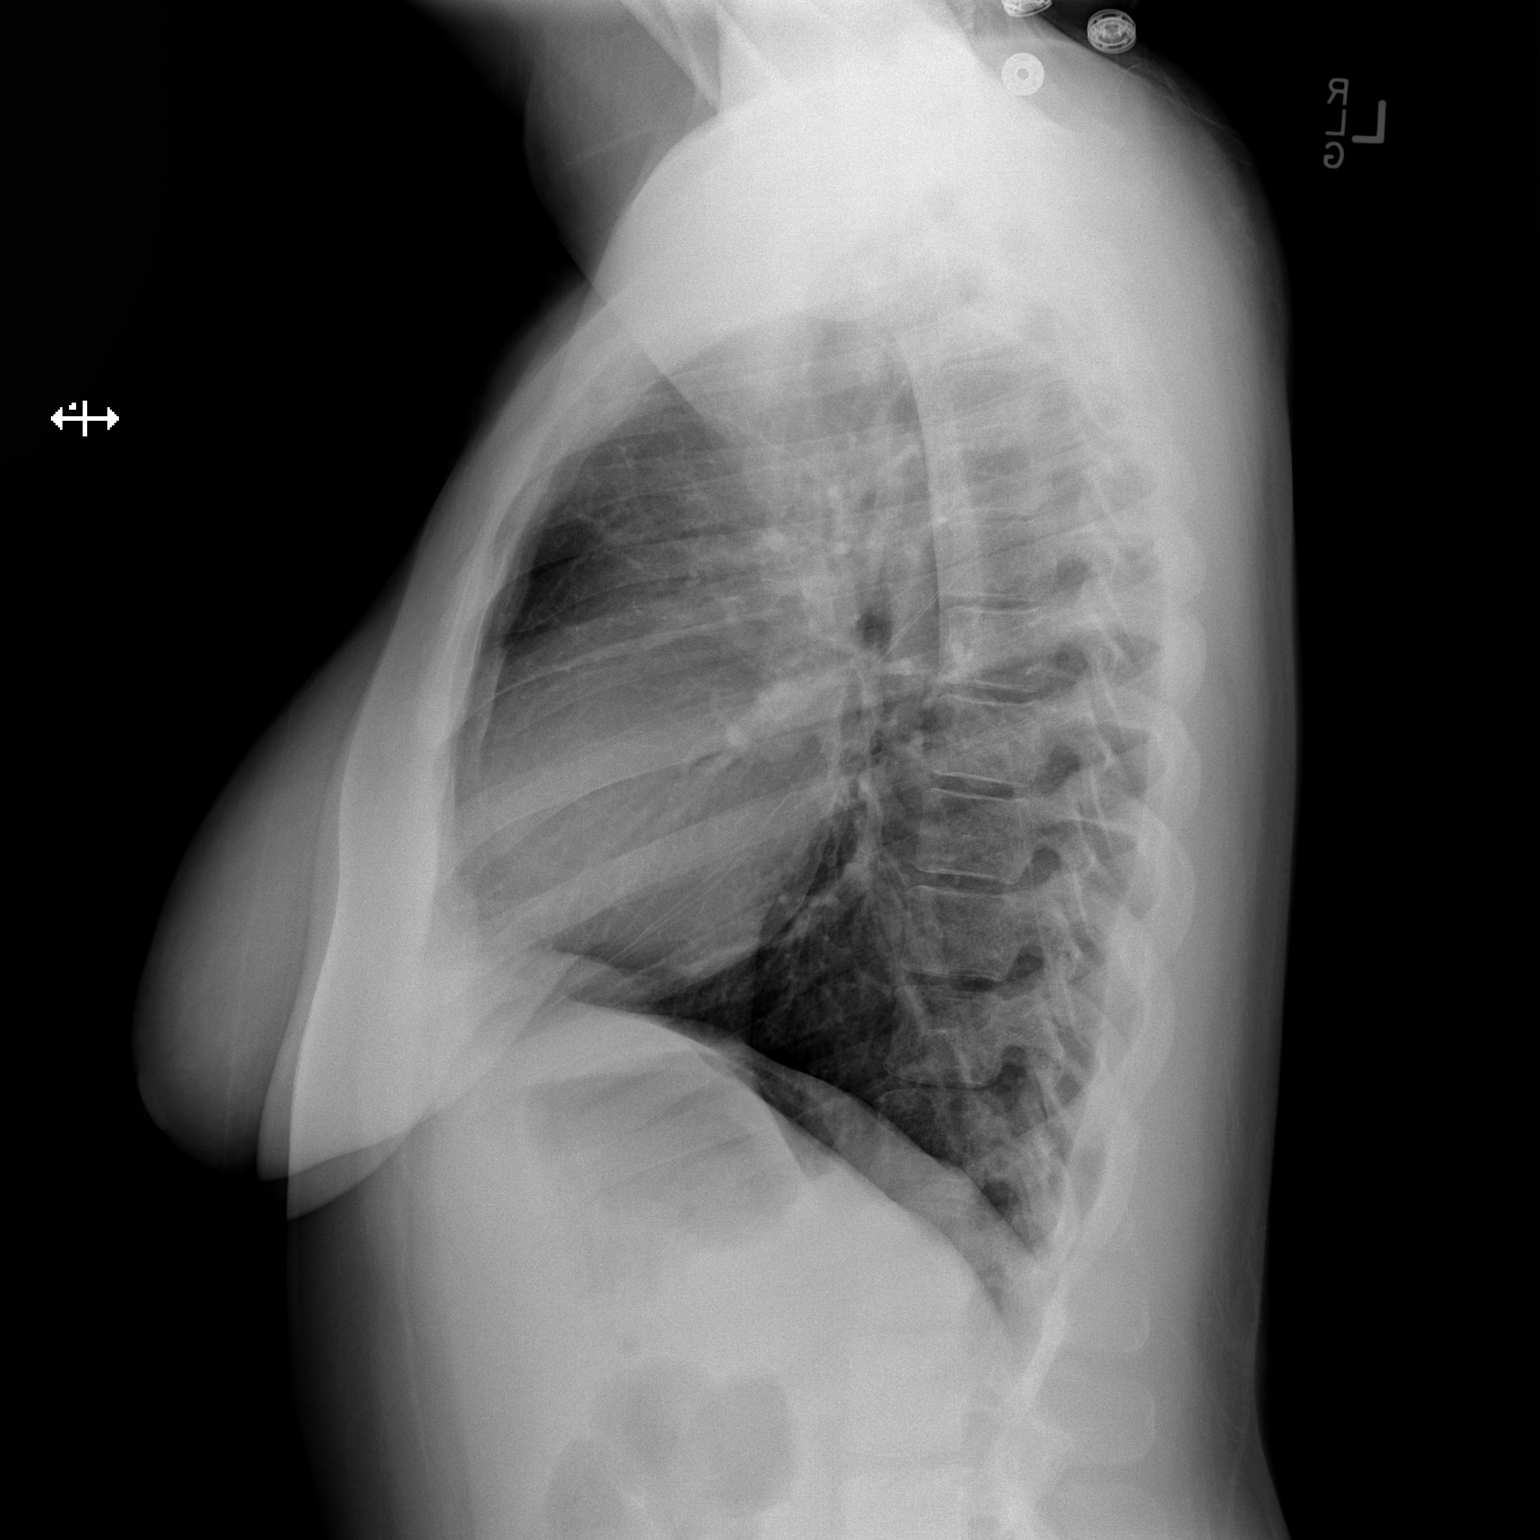

[2 of 2 positions shown; findings below may reference images not displayed]

FINDINGS: The heart size and mediastinal contours are within normal limits.
Both lungs are clear. The visualized skeletal structures are
unremarkable.
IMPRESSION: No active cardiopulmonary disease.

## 2017-06-30 ENCOUNTER — Other Ambulatory Visit: Payer: Self-pay

## 2017-06-30 ENCOUNTER — Ambulatory Visit (HOSPITAL_COMMUNITY)
Admission: EM | Admit: 2017-06-30 | Discharge: 2017-06-30 | Disposition: A | Discharge status: Home / Self Care | Payer: Medicaid Other | Attending: Family Medicine | Admitting: Family Medicine

## 2017-06-30 ENCOUNTER — Encounter (HOSPITAL_COMMUNITY): Payer: Self-pay | Admitting: Emergency Medicine

## 2017-06-30 DIAGNOSIS — J111 Influenza due to unidentified influenza virus with other respiratory manifestations: Secondary | ICD-10-CM

## 2017-06-30 DIAGNOSIS — R69 Illness, unspecified: Secondary | ICD-10-CM

## 2017-06-30 MED ORDER — HYDROCODONE-HOMATROPINE 5-1.5 MG/5ML PO SYRP: 5.0000 mL | ORAL_SOLUTION | Freq: Four times a day (QID) | ORAL | 0 refills | Status: DC | PRN

## 2017-06-30 MED ORDER — OSELTAMIVIR PHOSPHATE 75 MG PO CAPS: 75.0000 mg | ORAL_CAPSULE | Freq: Two times a day (BID) | ORAL | 0 refills | Status: DC

## 2017-06-30 NOTE — ED Triage Notes (Signed)
Pt states "my eyes hurt, my ears hurt, my head hurts, im having hot and cold flashes".

## 2017-06-30 NOTE — ED Provider Notes (Signed)
[facility]   [id number] 06/30/17 Arrival Time: 1139  ASSESSMENT & PLAN:  1. Influenza-like illness     Meds ordered this encounter  Medications  . oseltamivir (TAMIFLU) 75 MG capsule    Sig: Take 1 capsule (75 mg total) by mouth every 12 (twelve) hours.    Dispense:  10 capsule    Refill:  0  . HYDROcodone-homatropine (HYCODAN) 5-1.5 MG/5ML syrup    Sig: Take 5 mLs by mouth every 6 (six) hours as needed for cough.    Dispense:  90 mL    Refill:  0   Work note given. Cough medication sedation precautions. Discussed typical duration of symptoms. OTC symptom care as needed. Ensure adequate fluid intake and rest. May f/u with PCP or here as needed.  Reviewed expectations re: course of current medical issues. Questions answered. Outlined signs and symptoms indicating need for more acute intervention. Patient verbalized understanding. After Visit Summary given.   SUBJECTIVE: History from: patient.  [name] is a 32 y.o. female who presents with complaint of nasal congestion, post-nasal drainage, and a persistent dry cough. Onset abrupt, approximately 1 day ago. Overall fatigued with body aches. SOB: none. Wheezing: none. Fever: yes. Overall normal PO intake without n/v. Sick contacts: no. OTC treatment: none.  Received flu shot this year: no.  Social History   Tobacco Use  Smoking Status Former Smoker  . Packs/day: 1.50  . Years: 10.00  . Pack years: 15.00  . Types: Cigarettes  . Last attempt to quit: 04/23/2015  . Years since quitting: 2.1  Smokeless Tobacco Never Used    ROS: As per HPI.   OBJECTIVE:  Vitals:   06/30/17 1306  BP: 124/81  Pulse: 98  Resp: 20  Temp: 99.6 F (37.6 C)  TempSrc: Oral  SpO2: 100%     General appearance: alert; appears fatigued HEENT: nasal congestion; clear runny nose; throat irritation secondary to post-nasal drainage Neck: supple without LAD Lungs: unlabored respirations, symmetrical air entry;  cough: moderate; no respiratory distress Skin: warm and dry Psychological: alert and cooperative; normal mood and affect  Allergies  Allergen Reactions  . Amoxicillin Other (See Comments)    Pt states that this medication causes her skin to peel on her perineum, lower pubic area, and groin.  Has patient had a PCN reaction causing immediate rash, facial/tongue/throat swelling, SOB or lightheadedness with hypotension: No Has patient had a PCN reaction causing severe rash involving mucus membranes or skin necrosis: No Has patient had a PCN reaction that required hospitalization No Has patient had a PCN reaction occurring within the last 10 years: No If all of the above answers are "NO",  . Clindamycin/Lincomycin Other (See Comments)    Pt states that this medication causes her skin to peel on her perineum, lower pubic area, and groin.  . Metronidazole Other (See Comments)    Pt states that this medication causes her skin to peel on her perineum, lower pubic area, and groin.  . Amoxapine And Related Other (See Comments)    Pt states that this medication causes her skin to peel on her perineum, lower pubic area, and groin.    Past Medical History:  Diagnosis Date  . Anxiety   . Chlamydia   . Depression    2007  . Eczema   . Herpes   . Hx of varicella   . No pertinent past medical history   . Slipped cervical disc   . Superficial thrombophlebitis of right leg 2015  .  Trichomonas   . VBAC, delivered, current hospitalization 12/10/2015   Family History  Adopted: Yes  Problem Relation Age of Onset  . Other Neg Hx    Social History   Socioeconomic History  . Marital status: Single    Spouse name: Not on file  . Number of children: Not on file  . Years of education: Not on file  . Highest education level: Not on file  Social Needs  . Financial resource strain: Not on file  . Food insecurity - worry: Not on file  . Food insecurity - inability: Not on file  . Transportation  needs - medical: Not on file  . Transportation needs - non-medical: Not on file  Occupational History  . Not on file  Tobacco Use  . Smoking status: Former Smoker    Packs/day: 1.50    Years: 10.00    Pack years: 15.00    Types: Cigarettes    Last attempt to quit: 04/23/2015    Years since quitting: 2.1  . Smokeless tobacco: Never Used  Substance and Sexual Activity  . Alcohol use: No    Alcohol/week: 0.0 oz  . Drug use: No  . Sexual activity: Yes    Birth control/protection: None  Other Topics Concern  . Not on file  Social History Narrative  . Not on file           Mardella Layman, MD 06/30/17 1358

## 2017-06-30 NOTE — Discharge Instructions (Signed)
Be aware, your cough medication may cause drowsiness. Please do not drive, operate heavy machinery or make important decisions while on this medication, it can cloud your judgement.  The medications prescribed are not recommended if you are breast feeding.  Follow up with your primary care doctor or here if you are not seeing improvement of your symptoms over the next several days, sooner if you feel you are worsening.  Caring for yourself: Get plenty of rest. Drink plenty of fluids, enough so that your urine is light yellow or clear like water. If you have kidney, heart, or liver disease and have to limit fluids, talk with your doctor before you increase the amount of fluids you drink. Take an over-the-counter pain medicine if needed, such as acetaminophen (Tylenol), ibuprofen (Advil, Motrin), or naproxen (Aleve), to relieve fever, headache, and muscle aches. Read and follow all instructions on the label. No one younger than 20 should take aspirin. It has been linked to Reye syndrome, a serious illness. Before you use over the counter cough and cold medicines, check the label. These medicines may not be safe for children younger than age 266 or for people with certain health problems. If the skin around your nose and lips becomes sore, put some petroleum jelly on the area.  Avoid spreading the flu: Wash your hands regularly, and keep your hands away from your face.  Stay home from school, work, and other public places until you are feeling better and your fever has been gone for at least 24 hours. The fever needs to have gone away on its own without the help of medicine.

## 2018-04-28 DIAGNOSIS — J029 Acute pharyngitis, unspecified: Secondary | ICD-10-CM | POA: Diagnosis not present

## 2018-04-28 DIAGNOSIS — J069 Acute upper respiratory infection, unspecified: Secondary | ICD-10-CM | POA: Diagnosis not present

## 2019-04-13 ENCOUNTER — Telehealth: Payer: Self-pay | Admitting: *Deleted

## 2019-04-13 NOTE — Telephone Encounter (Signed)
Opened in error

## 2019-04-29 DIAGNOSIS — Z20828 Contact with and (suspected) exposure to other viral communicable diseases: Secondary | ICD-10-CM | POA: Diagnosis not present

## 2019-05-01 DIAGNOSIS — J069 Acute upper respiratory infection, unspecified: Secondary | ICD-10-CM | POA: Diagnosis not present

## 2019-05-01 DIAGNOSIS — R0602 Shortness of breath: Secondary | ICD-10-CM | POA: Diagnosis not present

## 2019-09-29 ENCOUNTER — Inpatient Hospital Stay: Payer: Medicaid Other | Admitting: Nurse Practitioner

## 2019-09-30 ENCOUNTER — Ambulatory Visit: Payer: Medicaid Other | Attending: Internal Medicine

## 2019-09-30 DIAGNOSIS — Z23 Encounter for immunization: Secondary | ICD-10-CM

## 2019-09-30 NOTE — Progress Notes (Signed)
   Covid-19 Vaccination Clinic  Name:  [name]    MRN: [id number] DOB: November 23, 1985  09/30/2019  [name] was observed post Covid-19 immunization for 15 minutes without incident. She was provided with Vaccine Information Sheet and instruction to access the V-Safe system.   [name] was instructed to call 911 with any severe reactions post vaccine: Marland Kitchen Difficulty breathing  . Swelling of face and throat  . A fast heartbeat  . A bad rash all over body  . Dizziness and weakness   Immunizations Administered    Name Date Dose VIS Date Route   Pfizer COVID-19 Vaccine 09/30/2019  9:07 AM 0.3 mL 07/21/2018 Intramuscular   Manufacturer: [employer], [employer]   Lot: [id number]   NDC: [address]-4

## 2019-10-26 ENCOUNTER — Ambulatory Visit: Payer: Medicaid Other | Attending: Internal Medicine

## 2019-10-26 DIAGNOSIS — Z23 Encounter for immunization: Secondary | ICD-10-CM

## 2019-10-26 NOTE — Progress Notes (Signed)
   Covid-19 Vaccination Clinic  Name:  WHITNIE DELEON    MRN: [id number] DOB: March 08, 1986  10/26/2019  Ms. Whitt was observed post Covid-19 immunization for 15 minutes without incident. She was provided with Vaccine Information Sheet and instruction to access the V-Safe system.   Ms. Carreras was instructed to call 911 with any severe reactions post vaccine: Marland Kitchen Difficulty breathing  . Swelling of face and throat  . A fast heartbeat  . A bad rash all over body  . Dizziness and weakness   Immunizations Administered    Name Date Dose VIS Date Route   Pfizer COVID-19 Vaccine 10/26/2019  9:07 AM 0.3 mL 07/21/2018 Intramuscular   Manufacturer: [employer], [employer]   Lot: [id number]   NDC: [address]-1

## 2019-11-15 ENCOUNTER — Ambulatory Visit: Payer: Medicaid Other | Attending: Internal Medicine

## 2019-11-15 DIAGNOSIS — Z20822 Contact with and (suspected) exposure to covid-19: Secondary | ICD-10-CM | POA: Diagnosis not present

## 2019-11-16 LAB — SARS-COV-2, NAA 2 DAY TAT

## 2019-11-16 LAB — NOVEL CORONAVIRUS, NAA: SARS-CoV-2, NAA: NOT DETECTED

## 2019-12-13 ENCOUNTER — Encounter (HOSPITAL_COMMUNITY): Payer: Self-pay

## 2019-12-13 ENCOUNTER — Other Ambulatory Visit: Payer: Self-pay

## 2019-12-13 ENCOUNTER — Ambulatory Visit (HOSPITAL_COMMUNITY)
Admission: EM | Admit: 2019-12-13 | Discharge: 2019-12-13 | Disposition: A | Discharge status: Home / Self Care | Payer: Medicaid Other | Attending: Family Medicine | Admitting: Family Medicine

## 2019-12-13 DIAGNOSIS — R590 Localized enlarged lymph nodes: Secondary | ICD-10-CM

## 2019-12-13 NOTE — ED Provider Notes (Signed)
[facility]    CSN: [id number] Arrival date & time: 12/13/19  7824      History   Chief Complaint Chief Complaint  Patient presents with  . swelling to right groin    HPI [name] is a 34 y.o. female.   [name] presents with complaints of swelling and pain to right groin/pelvis which she first noted yesterday. No redness or warmth. No urinary or vaginal symptoms. No gi symptoms. No lower extremity complaints. No heavy lifting. Tender to touch but not with movement. Denies any previous similar. No fevers. No rash. No other areas of swelling- neck, axilla, clavicular.    ROS per HPI, negative if not otherwise mentioned.      Past Medical History:  Diagnosis Date  . Anxiety   . Chlamydia   . Depression    2007  . Eczema   . Herpes   . Hx of varicella   . No pertinent past medical history   . Slipped cervical disc   . Superficial thrombophlebitis of right leg 2015  . Trichomonas   . VBAC, delivered, current hospitalization 12/10/2015    Patient Active Problem List   Diagnosis Date Noted  . VBAC, delivered, current hospitalization 12/10/2015  . Active labor at term 12/09/2015    Past Surgical History:  Procedure Laterality Date  . CESAREAN SECTION    . WISDOM TOOTH EXTRACTION      OB History    Gravida  3   Para  2   Term  2   Preterm      AB  1   Living  1     SAB  1   TAB      Ectopic      Multiple  0   Live Births  1            Home Medications    Prior to Admission medications   Medication Sig Start Date End Date Taking? Authorizing Provider  HYDROcodone-homatropine (HYCODAN) 5-1.5 MG/5ML syrup Take 5 mLs by mouth every 6 (six) hours as needed for cough. 06/30/17   Mardella Layman, MD  ibuprofen (ADVIL,MOTRIN) 600 MG tablet Take 1 tablet (600 mg total) by mouth every 6 (six) hours. 12/12/15   Edwinna Areola, DO  oseltamivir (TAMIFLU) 75 MG capsule Take 1 capsule (75 mg total) by mouth every 12  (twelve) hours. 06/30/17   Mardella Layman, MD  Prenat w/o A Vit-FeFum-FePo-FA (CONCEPT OB) 130-92.4-1 MG CAPS Take 1 capsule by mouth daily.    [provider]  ranitidine (ZANTAC) 150 MG tablet Take 150 mg by mouth 2 (two) times daily as needed for heartburn.    [provider]  valACYclovir (VALTREX) 500 MG tablet Take 500 mg by mouth 2 (two) times daily.    [provider]    Family History Family History  Adopted: Yes  Problem Relation Age of Onset  . Other Neg Hx     Social History Social History   Tobacco Use  . Smoking status: Former Smoker    Packs/day: 1.50    Years: 10.00    Pack years: 15.00    Types: Cigarettes    Quit date: 04/23/2015    Years since quitting: 4.6  . Smokeless tobacco: Never Used  Substance Use Topics  . Alcohol use: No    Alcohol/week: 0.0 standard drinks  . Drug use: No     Allergies   Amoxicillin, Clindamycin/lincomycin, Metronidazole, and Amoxapine and related  Review of Systems Review of Systems   Physical Exam Triage Vital Signs ED Triage Vitals [12/13/19 0902]  Enc Vitals Group     BP 112/82     Pulse Rate 84     Resp 18     Temp 98.6 F (37 C)     Temp Source Oral     SpO2 100 %     Weight      Height      Head Circumference      Peak Flow      Pain Score 5     Pain Loc      Pain Edu?      Excl. in GC?    No data found.  Updated Vital Signs BP 112/82 (BP Location: Left Arm)   Pulse 84   Temp 98.6 F (37 C) (Oral)   Resp 18   LMP 11/18/2019 (Approximate)   SpO2 100%    Physical Exam Constitutional:      General: She is not in acute distress.    Appearance: She is well-developed.  Cardiovascular:     Rate and Rhythm: Normal rate.  Pulmonary:     Effort: Pulmonary effort is normal.  Lymphadenopathy:     Cervical: No cervical adenopathy.     Upper Body:     Right upper body: No supraclavicular or axillary adenopathy.     Left upper body: No supraclavicular or axillary  adenopathy.     Lower Body: Right inguinal adenopathy present.     Comments: Mild tenderness to right inguinal node with edema noted; no redness; no palpable hernia; soft  Skin:    General: Skin is warm and dry.  Neurological:     Mental Status: She is alert and oriented to person, place, and time.      UC Treatments / Results  Labs (all labs ordered are listed, but only abnormal results are displayed) Labs Reviewed - No data to display  EKG   Radiology No results found.  Procedures Procedures (including critical care time)  Medications Ordered in UC Medications - No data to display  Initial Impression / Assessment and Plan / UC Course  I have reviewed the triage vital signs and the nursing notes.  Pertinent labs & imaging results that were available during my care of the patient were reviewed by me and considered in my medical decision making (see chart for details).     Exam consistent with right inguinal lymphadenopathy. No source at this time. <24 hours of symptoms. Supportive management recommended at this time. Follow up and return precautions discussed. Patient verbalized understanding and agreeable to plan.   Final Clinical Impressions(s) / UC Diagnoses   Final diagnoses:  Lymphadenopathy, inguinal     Discharge Instructions     Lets continue to monitor this over the next 1-2 weeks.  Please return for any worsening- pain, fevers, redness or warmth, other symptoms or illness.  Please follow up with pcp if persistent or recurrent.     ED Prescriptions    None     PDMP not reviewed this encounter.   Georgetta Haber, NP 12/13/19 1023

## 2019-12-13 NOTE — ED Triage Notes (Signed)
Pt c/o right groin pain/swelling upon waking yesterday. Pt denies vaginal discharge, abdominal pain, n/v/d, fever, chills or dysuria sx.  Area of approx 7cm edematous and tender on palpation in right groin.

## 2019-12-13 NOTE — Discharge Instructions (Signed)
Lets continue to monitor this over the next 1-2 weeks.  Please return for any worsening- pain, fevers, redness or warmth, other symptoms or illness.  Please follow up with pcp if persistent or recurrent.

## 2019-12-27 DIAGNOSIS — J020 Streptococcal pharyngitis: Secondary | ICD-10-CM | POA: Diagnosis not present

## 2019-12-27 DIAGNOSIS — L538 Other specified erythematous conditions: Secondary | ICD-10-CM | POA: Diagnosis not present

## 2020-02-15 ENCOUNTER — Other Ambulatory Visit: Payer: Medicaid Other

## 2020-02-15 DIAGNOSIS — Z20822 Contact with and (suspected) exposure to covid-19: Secondary | ICD-10-CM | POA: Diagnosis not present

## 2020-02-17 LAB — NOVEL CORONAVIRUS, NAA: SARS-CoV-2, NAA: NOT DETECTED

## 2020-02-17 LAB — SARS-COV-2, NAA 2 DAY TAT

## 2020-02-18 DIAGNOSIS — L03031 Cellulitis of right toe: Secondary | ICD-10-CM | POA: Diagnosis not present

## 2020-02-18 DIAGNOSIS — M79674 Pain in right toe(s): Secondary | ICD-10-CM | POA: Diagnosis not present

## 2020-08-10 ENCOUNTER — Encounter (HOSPITAL_COMMUNITY): Payer: Self-pay | Admitting: *Deleted

## 2020-08-10 ENCOUNTER — Emergency Department (HOSPITAL_COMMUNITY)
Admission: EM | Admit: 2020-08-10 | Discharge: 2020-08-10 | Disposition: A | Discharge status: Home / Self Care | Payer: Medicaid Other | Attending: Emergency Medicine | Admitting: Emergency Medicine

## 2020-08-10 DIAGNOSIS — J029 Acute pharyngitis, unspecified: Secondary | ICD-10-CM | POA: Diagnosis not present

## 2020-08-10 DIAGNOSIS — Z87891 Personal history of nicotine dependence: Secondary | ICD-10-CM | POA: Insufficient documentation

## 2020-08-10 DIAGNOSIS — Z20822 Contact with and (suspected) exposure to covid-19: Secondary | ICD-10-CM | POA: Insufficient documentation

## 2020-08-10 DIAGNOSIS — R067 Sneezing: Secondary | ICD-10-CM

## 2020-08-10 DIAGNOSIS — H9203 Otalgia, bilateral: Secondary | ICD-10-CM | POA: Diagnosis not present

## 2020-08-10 DIAGNOSIS — R454 Irritability and anger: Secondary | ICD-10-CM | POA: Diagnosis not present

## 2020-08-10 LAB — GROUP A STREP BY PCR: Group A Strep by PCR: NOT DETECTED

## 2020-08-10 LAB — SARS CORONAVIRUS 2 (TAT 6-24 HRS): SARS Coronavirus 2: NEGATIVE

## 2020-08-10 MED ORDER — LIDOCAINE VISCOUS HCL 2 % MT SOLN
15.0000 mL | Freq: Once | OROMUCOSAL | Status: AC
  Administered 2020-08-10: 15 mL via OROMUCOSAL
  Filled 2020-08-10: qty 15

## 2020-08-10 MED ORDER — CETIRIZINE HCL 10 MG PO TABS: 10.0000 mg | ORAL_TABLET | Freq: Every day | ORAL | 0 refills | Status: DC

## 2020-08-10 NOTE — ED Triage Notes (Signed)
Pt reports itchy ears, sore throat x 2 days. She went to [facility] ED this morning, states ears were clean and wanted to do a strep test but she did not want to stay. No fever.

## 2020-08-10 NOTE — ED Provider Notes (Signed)
[facility]-EMERGENCY DEPT Provider Note   CSN: [id number] Arrival date & time: 08/10/20  1024     History Chief Complaint  Patient presents with  . Sore Throat    [name] is a 35 y.o. female with a past medical history significant for anxiety and depression who presents to the ED due to sneezing, sore throat, and bilateral otalgia.  Patient states symptoms started 3 days ago which has progressively worsened.  Denies seasonal allergies.  Denies sick contacts and known Covid exposures.  Denies fever, chills, abdominal pain, nausea, vomiting.  Admits to a few episodes of nonbloody diarrhea.  She has received her COVID-19 vaccine; however, not her booster shot. Describes sore throat as a burning sensation worse when swallowing. Denies difficulty swallowing, difficulties breathing, and changes to phonation. No oral intercourse for 1 month. No concern for STDs. Chart reviewed. Patient was evaluated at [facility] ED prior to arrival for some complaint; however became upset and let prior to strep test. No treatment prior to arrival.   History obtained from patient and past medical records. No interpreter used during encounter.      Past Medical History:  Diagnosis Date  . Anxiety   . Chlamydia   . Depression    2007  . Eczema   . Herpes   . Hx of varicella   . No pertinent past medical history   . Slipped cervical disc   . Superficial thrombophlebitis of right leg 2015  . Trichomonas   . VBAC, delivered, current hospitalization 12/10/2015    Patient Active Problem List   Diagnosis Date Noted  . VBAC, delivered, current hospitalization 12/10/2015  . Active labor at term 12/09/2015    Past Surgical History:  Procedure Laterality Date  . CESAREAN SECTION    . WISDOM TOOTH EXTRACTION       OB History    Gravida  3   Para  2   Term  2   Preterm      AB  1   Living  1     SAB  1   IAB      Ectopic      Multiple  0   Live Births  1            Family History  Adopted: Yes  Problem Relation Age of Onset  . Other Neg Hx     Social History   Tobacco Use  . Smoking status: Former Smoker    Packs/day: 1.50    Years: 10.00    Pack years: 15.00    Types: Cigarettes    Quit date: 04/23/2015    Years since quitting: 5.3  . Smokeless tobacco: Never Used  Substance Use Topics  . Alcohol use: No    Alcohol/week: 0.0 standard drinks  . Drug use: No    Home Medications Prior to Admission medications   Medication Sig Start Date End Date Taking? Authorizing Provider  cetirizine (ZYRTEC ALLERGY) 10 MG tablet Take 1 tablet (10 mg total) by mouth daily. 08/10/20  Yes Aberman, Merla Riches, PA-C  HYDROcodone-homatropine (HYCODAN) 5-1.5 MG/5ML syrup Take 5 mLs by mouth every 6 (six) hours as needed for cough. 06/30/17   Mardella Layman, MD  ibuprofen (ADVIL,MOTRIN) 600 MG tablet Take 1 tablet (600 mg total) by mouth every 6 (six) hours. 12/12/15   Edwinna Areola, DO  oseltamivir (TAMIFLU) 75 MG capsule Take 1 capsule (75 mg total) by mouth every 12 (twelve) hours. 06/30/17  Mardella Layman, MD  Prenat w/o A Vit-FeFum-FePo-FA (CONCEPT OB) 130-92.4-1 MG CAPS Take 1 capsule by mouth daily.    [provider]  ranitidine (ZANTAC) 150 MG tablet Take 150 mg by mouth 2 (two) times daily as needed for heartburn.    [provider]  valACYclovir (VALTREX) 500 MG tablet Take 500 mg by mouth 2 (two) times daily.    [provider]    Allergies    Amoxicillin, Clindamycin/lincomycin, Metronidazole, and Amoxapine and related  Review of Systems   Review of Systems  Constitutional: Negative for chills and fever.  HENT: Positive for ear pain, sneezing and sore throat. Negative for congestion, rhinorrhea, trouble swallowing and voice change.   Respiratory: Negative for shortness of breath.   Cardiovascular: Negative for chest pain.  Gastrointestinal: Positive for diarrhea. Negative for abdominal pain, nausea and  vomiting.  All other systems reviewed and are negative.   Physical Exam Updated Vital Signs BP 128/89 (BP Location: Left Arm)   Pulse 75   Temp 98.1 F (36.7 C) (Oral)   Resp 18   LMP 08/03/2020   SpO2 100%   Physical Exam Vitals and nursing note reviewed.  Constitutional:      General: She is not in acute distress. HENT:     Head: Normocephalic.     Right Ear: Tympanic membrane normal.     Left Ear: Tympanic membrane normal.     Ears:     Comments: TMs unremarkable bilaterally.    Mouth/Throat:     Comments: Posterior oropharynx clear and mucous membranes moist, there is mild erythema but no edema or tonsillar exudates, uvula midline, normal phonation, no trismus, tolerating secretions without difficulty. Eyes:     Pupils: Pupils are equal, round, and reactive to light.  Cardiovascular:     Rate and Rhythm: Normal rate and regular rhythm.     Pulses: Normal pulses.     Heart sounds: Normal heart sounds. No murmur heard. No friction rub. No gallop.   Pulmonary:     Effort: Pulmonary effort is normal.     Breath sounds: Normal breath sounds.  Abdominal:     General: Abdomen is flat. There is no distension.     Palpations: Abdomen is soft.     Tenderness: There is no abdominal tenderness. There is no guarding or rebound.  Musculoskeletal:        General: Normal range of motion.     Cervical back: Neck supple.  Skin:    General: Skin is warm and dry.  Neurological:     General: No focal deficit present.  Psychiatric:        Mood and Affect: Mood normal.        Behavior: Behavior normal.     ED Results / Procedures / Treatments   Labs (all labs ordered are listed, but only abnormal results are displayed) Labs Reviewed  GROUP A STREP BY PCR  SARS CORONAVIRUS 2 (TAT 6-24 HRS)    EKG None  Radiology No results found.  Procedures Procedures   Medications Ordered in ED Medications  lidocaine (XYLOCAINE) 2 % viscous mouth solution 15 mL (15 mLs  Mouth/Throat Given 08/10/20 1140)    ED Course  I have reviewed the triage vital signs and the nursing notes.  Pertinent labs & imaging results that were available during my care of the patient were reviewed by me and considered in my medical decision making (see chart for details).    MDM Rules/Calculators/A&P  35 year old female presents to the ED due to sneezing, sore throat, bilateral otalgia x3 days.  Patient evaluated at [facility] ED prior to arrival however, left without strep test. No fever or chills. Vitals all within normal limits upon arrival. Patient non-toxic appearing. Physical exam reassuring. No tonsillar hypertrophy or exudates.  Mild erythema.  No abscess.  Patient tolerating oral secretions without difficulty.  No meningismus to suggest meningitis.  TMs normal bilaterally. No signs of AOM. Suspect symptoms related to viral infection vs. Seasonal allergies. Strep test and COVID test ordered. Viscous lidocaine for sore throat.   Strep test negative. COVID test pending.  Advised patient to self quarantine until results become available.  Patient discharged with Zyrtec for possible seasonal allergies.  Advised patient to follow-up with PCP if symptoms do not improved within the next week. Strict ED precautions discussed with patient. Patient states understanding and agrees to plan. Patient discharged home in no acute distress and stable vitals  [name] was evaluated in Emergency Department on 08/10/2020 for the symptoms described in the history of present illness. She was evaluated in the context of the global COVID-19 pandemic, which necessitated consideration that the patient might be at risk for infection with the SARS-CoV-2 virus that causes COVID-19. Institutional protocols and algorithms that pertain to the evaluation of patients at risk for COVID-19 are in a state of rapid change based on information released by regulatory bodies including the CDC and  federal and state organizations. These policies and algorithms were followed during the patient's care in the ED. Final Clinical Impression(s) / ED Diagnoses Final diagnoses:  Viral pharyngitis  Otalgia of both ears  Sneezing    Rx / DC Orders ED Discharge Orders         Ordered    cetirizine (ZYRTEC ALLERGY) 10 MG tablet  Daily        08/10/20 1305           Jesusita Oka 08/10/20 1308    Wynetta Fines, MD 08/12/20 2157

## 2020-08-10 NOTE — Discharge Instructions (Addendum)
As discussed, your strep test is negative. Your symptoms could be related to a viral infection or season allergies. I am sending you home with allergy medicine. Take as prescribed.  Your Covid test is pending.  Results should be available within the next 24 hours.  Continue to self quarantine until your results are available.  Please follow-up with PCP if symptoms not improved within the next week.  Return to the ER for any worsening symptoms.

## 2020-08-11 ENCOUNTER — Telehealth: Payer: Self-pay | Admitting: *Deleted

## 2020-08-11 NOTE — Telephone Encounter (Signed)
Transition Care Management Unsuccessful Follow-up Telephone Call  Date of discharge and from where:  08/10/2020 [name]  Attempts:  1st Attempt  Reason for unsuccessful TCM follow-up call:  Left voice message

## 2020-08-14 NOTE — Telephone Encounter (Signed)
Transition Care Management Unsuccessful Follow-up Telephone Call  Date of discharge and from where:  08/10/2020 from [facility]  Attempts:  2nd Attempt  Reason for unsuccessful TCM follow-up call:  Left voice message     

## 2020-08-15 NOTE — Telephone Encounter (Signed)
Transition Care Management Unsuccessful Follow-up Telephone Call  Date of discharge and from where:  08/10/2020 - [facility]  Attempts:  3rd Attempt  Reason for unsuccessful TCM follow-up call:  Left voice message

## 2020-09-28 ENCOUNTER — Other Ambulatory Visit (HOSPITAL_COMMUNITY)
Admission: RE | Admit: 2020-09-28 | Discharge: 2020-09-28 | Disposition: A | Discharge status: Home / Self Care | Payer: Medicaid Other | Source: Ambulatory Visit | Attending: Family Medicine | Admitting: Family Medicine

## 2020-09-28 ENCOUNTER — Ambulatory Visit: Payer: Medicaid Other | Attending: Family Medicine | Admitting: Family Medicine

## 2020-09-28 ENCOUNTER — Other Ambulatory Visit: Payer: Self-pay

## 2020-09-28 ENCOUNTER — Encounter: Payer: Self-pay | Admitting: Family Medicine

## 2020-09-28 VITALS — BP 100/67 | HR 80 | Ht 65.0 in | Wt 179.8 lb

## 2020-09-28 DIAGNOSIS — Z124 Encounter for screening for malignant neoplasm of cervix: Secondary | ICD-10-CM

## 2020-09-28 DIAGNOSIS — Z113 Encounter for screening for infections with a predominantly sexual mode of transmission: Secondary | ICD-10-CM

## 2020-09-28 DIAGNOSIS — Z0000 Encounter for general adult medical examination without abnormal findings: Secondary | ICD-10-CM

## 2020-09-28 DIAGNOSIS — Z1159 Encounter for screening for other viral diseases: Secondary | ICD-10-CM

## 2020-09-28 DIAGNOSIS — L308 Other specified dermatitis: Secondary | ICD-10-CM | POA: Diagnosis not present

## 2020-09-28 DIAGNOSIS — Z0001 Encounter for general adult medical examination with abnormal findings: Secondary | ICD-10-CM | POA: Diagnosis not present

## 2020-09-28 MED ORDER — TRIAMCINOLONE ACETONIDE 0.1 % EX CREA: 1.0000 "application " | TOPICAL_CREAM | Freq: Two times a day (BID) | CUTANEOUS | 1 refills | Status: DC

## 2020-09-28 MED ORDER — PREDNISONE 20 MG PO TABS: 20.0000 mg | ORAL_TABLET | Freq: Every day | ORAL | 0 refills | Status: DC

## 2020-09-28 NOTE — Progress Notes (Signed)
Has itchy skin.

## 2020-09-28 NOTE — Progress Notes (Signed)
Subjective:  Patient ID: [name], female    DOB: 1985/06/06  Age: 35 y.o. MRN: [id number]  CC: New Patient (Initial Visit)   HPI [name] is a 35 year old female who presents today for a complete physical exam. She has a 2 month history itching in the lateral aspect of her hands and in her R palm. Her L breast started itching 1 month ago She has a history of Eczema. She used her daughter's Triamcinolone cream with some improvement.  Daughter also has eczema; she is adopted and she is unsure of her family history. Past Medical History:  Diagnosis Date  . Anxiety   . Chlamydia   . Depression    2007  . Eczema   . Herpes   . Hx of varicella   . No pertinent past medical history   . Slipped cervical disc   . Superficial thrombophlebitis of right leg 2015  . Trichomonas   . VBAC, delivered, current hospitalization 12/10/2015    Past Surgical History:  Procedure Laterality Date  . CESAREAN SECTION    . WISDOM TOOTH EXTRACTION      Family History  Adopted: Yes  Problem Relation Age of Onset  . Other Neg Hx     Allergies  Allergen Reactions  . Amoxicillin Other (See Comments)    Pt states that this medication causes her skin to peel on her perineum, lower pubic area, and groin.  Has patient had a PCN reaction causing immediate rash, facial/tongue/throat swelling, SOB or lightheadedness with hypotension: No Has patient had a PCN reaction causing severe rash involving mucus membranes or skin necrosis: No Has patient had a PCN reaction that required hospitalization No Has patient had a PCN reaction occurring within the last 10 years: No If all of the above answers are "NO",  . Clindamycin/Lincomycin Other (See Comments)    Pt states that this medication causes her skin to peel on her perineum, lower pubic area, and groin.  . Metronidazole Other (See Comments) and Nausea And Vomiting    Pt states that this medication causes her skin to peel on her perineum,  lower pubic area, and groin.  . Amoxapine And Related Other (See Comments)    Pt states that this medication causes her skin to peel on her perineum, lower pubic area, and groin.    Outpatient Medications Prior to Visit  Medication Sig Dispense Refill  . cetirizine (ZYRTEC ALLERGY) 10 MG tablet Take 1 tablet (10 mg total) by mouth daily. 30 tablet 0  . HYDROcodone-homatropine (HYCODAN) 5-1.5 MG/5ML syrup Take 5 mLs by mouth every 6 (six) hours as needed for cough. 90 mL 0  . ibuprofen (ADVIL,MOTRIN) 600 MG tablet Take 1 tablet (600 mg total) by mouth every 6 (six) hours. 60 tablet 1  . oseltamivir (TAMIFLU) 75 MG capsule Take 1 capsule (75 mg total) by mouth every 12 (twelve) hours. 10 capsule 0  . Prenat w/o A Vit-FeFum-FePo-FA (CONCEPT OB) 130-92.4-1 MG CAPS Take 1 capsule by mouth daily.    . ranitidine (ZANTAC) 150 MG tablet Take 150 mg by mouth 2 (two) times daily as needed for heartburn.    . valACYclovir (VALTREX) 500 MG tablet Take 500 mg by mouth 2 (two) times daily.  1   No facility-administered medications prior to visit.     ROS Review of Systems  Constitutional: Negative for activity change, appetite change and fatigue.  HENT: Negative for congestion, sinus pressure and sore throat.   Eyes: Negative for  visual disturbance.  Respiratory: Negative for cough, chest tightness, shortness of breath and wheezing.   Cardiovascular: Negative for chest pain and palpitations.  Gastrointestinal: Negative for abdominal distention, abdominal pain and constipation.  Endocrine: Negative for polydipsia.  Genitourinary: Negative for dysuria and frequency.  Musculoskeletal: Negative for arthralgias and back pain.  Skin: Negative for rash.  Neurological: Negative for tremors, light-headedness and numbness.  Hematological: Does not bruise/bleed easily.  Psychiatric/Behavioral: Negative for agitation and behavioral problems.    Objective:  BP 100/67   Pulse 80   Ht _0  (1.651 m)   Wt  179 lb 12.8 oz (81.6 kg)   SpO2 100%   BMI 29.92 kg/m   BP/Weight 09/28/2020 08/10/2020 07/07/1733  Systolic BP 670 141 030  Diastolic BP 67 81 82  Wt. (Lbs) 179.8 - -  BMI 29.92 - -      Physical Exam Constitutional:      General: She is not in acute distress.    Appearance: She is well-developed. She is not diaphoretic.  HENT:     Head: Normocephalic.     Right Ear: External ear normal.     Left Ear: External ear normal.     Nose: Nose normal.  Eyes:     Conjunctiva/sclera: Conjunctivae normal.     Pupils: Pupils are equal, round, and reactive to light.  Neck:     Vascular: No JVD.  Cardiovascular:     Rate and Rhythm: Normal rate and regular rhythm.     Heart sounds: Normal heart sounds. No murmur heard. No gallop.   Pulmonary:     Effort: Pulmonary effort is normal. No respiratory distress.     Breath sounds: Normal breath sounds. No wheezing or rales.  Chest:     Chest wall: No tenderness.  Breasts:     Right: Skin change (dry scaly rash around areola) present.     Left: Skin change (dry scaly rash around areola) present.    Abdominal:     General: Bowel sounds are normal. There is no distension.     Palpations: Abdomen is soft. There is no mass.     Tenderness: There is no abdominal tenderness.  Musculoskeletal:        General: No tenderness. Normal range of motion.     Cervical back: Normal range of motion.  Skin:    Comments: Dry scaly lesions with lichenification on lateral aspect of hands, central right palm.  Scaly lesions widely distributed in extremities with hyperpigmentation and some areas  Neurological:     Mental Status: She is alert and oriented to person, place, and time.     Deep Tendon Reflexes: Reflexes are normal and symmetric.  Psychiatric:        Mood and Affect: Mood normal.     CMP Latest Ref Rng & Units 11/13/2013 06/22/2013 01/08/2013  Glucose 70 - 99 mg/dL 87 92 92  BUN 6 - 23 mg/dL _1 Creatinine 0.50 - 1.10 mg/dL 0.84 0.87 0.70   Sodium 137 - 147 mEq/L 139 140 135  Potassium 3.7 - 5.3 mEq/L 4.5 4.4 3.5  Chloride 96 - 112 mEq/L 102 105 99  CO2 19 - 32 mEq/L _2 Calcium 8.4 - 10.5 mg/dL 9.3 9.1 9.7  Total Protein 6.0 - 8.3 g/dL 6.7 6.8 7.8  Total Bilirubin 0.3 - 1.2 mg/dL 0.2(L) <0.2(L) 0.3  Alkaline Phos 39 - 117 U/L 46 48 59  AST 0 - 37 U/L 11 14 13  ALT 0 - 35 U/L _0 Lipid Panel  No results found for: CHOL, TRIG, HDL, CHOLHDL, VLDL, LDLCALC, LDLDIRECT  CBC    Component Value Date/Time   WBC 19.1 (H) 12/10/2015 0601   RBC 2.98 (L) 12/10/2015 0601   HGB 9.1 (L) 12/10/2015 0601   HCT 27.0 (L) 12/10/2015 0601   PLT 217 12/10/2015 0601   MCV 90.6 12/10/2015 0601   MCV 94.0 05/12/2014 1630   MCH 30.5 12/10/2015 0601   MCHC 33.7 12/10/2015 0601   RDW 14.0 12/10/2015 0601   LYMPHSABS 4.2 (H) 11/13/2013 1327   MONOABS 0.4 11/13/2013 1327   EOSABS 0.2 11/13/2013 1327   BASOSABS 0.0 11/13/2013 1327    No results found for: HGBA1C  Assessment & Plan:  1. Annual physical exam Counseled on 150 minutes of exercise per week, healthy eating (including decreased daily intake of saturated fats, cholesterol, added sugars, sodium), STI prevention, routine healthcare maintenance. - CMP14+EGFR; Future - Lipid panel; Future  2. Screening for cervical cancer - Cytology - PAP([place])  3. Other eczema Discussed use of hypoallergenic substances Will reassess at next visit and if uncontrolled consider referring to dermatology due to widespread nature - predniSONE (DELTASONE) 20 MG tablet; Take 1 tablet (20 mg total) by mouth daily with breakfast.  Dispense: 5 tablet; Refill: 0 - triamcinolone cream (KENALOG) 0.1 %; Apply 1 application topically 2 (two) times daily.  Dispense: 80 g; Refill: 1 - CBC with Differential/Platelet; Future  4. Screening for STD (sexually transmitted disease) - Cervicovaginal ancillary only  5. Need for hepatitis C screening test - HCV RNA quant rflx ultra or  genotyp([facility]); Future    No orders of the defined types were placed in this encounter.   Follow-up: No follow-ups on file.       Charlott Rakes, MD, FAAFP. [facility] [place] [place], [place]   09/28/2020, 9:43 AM

## 2020-09-28 NOTE — Patient Instructions (Signed)
Health Maintenance, Female Adopting a healthy lifestyle and getting preventive care are important in promoting health and wellness. Ask your health care provider about:  The right schedule for you to have regular tests and exams.  Things you can do on your own to prevent diseases and keep yourself healthy. What should I know about diet, weight, and exercise? Eat a healthy diet  Eat a diet that includes plenty of vegetables, fruits, low-fat dairy products, and lean protein.  Do not eat a lot of foods that are high in solid fats, added sugars, or sodium.   Maintain a healthy weight Body mass index (BMI) is used to identify weight problems. It estimates body fat based on height and weight. Your health care provider can help determine your BMI and help you achieve or maintain a healthy weight. Get regular exercise Get regular exercise. This is one of the most important things you can do for your health. Most adults should:  Exercise for at least 150 minutes each week. The exercise should increase your heart rate and make you sweat (moderate-intensity exercise).  Do strengthening exercises at least twice a week. This is in addition to the moderate-intensity exercise.  Spend less time sitting. Even light physical activity can be beneficial. Watch cholesterol and blood lipids Have your blood tested for lipids and cholesterol at 34 years of age, then have this test every 5 years. Have your cholesterol levels checked more often if:  Your lipid or cholesterol levels are high.  You are older than 34 years of age.  You are at high risk for heart disease. What should I know about cancer screening? Depending on your health history and family history, you may need to have cancer screening at various ages. This may include screening for:  Breast cancer.  Cervical cancer.  Colorectal cancer.  Skin cancer.  Lung cancer. What should I know about heart disease, diabetes, and high blood  pressure? Blood pressure and heart disease  High blood pressure causes heart disease and increases the risk of stroke. This is more likely to develop in people who have high blood pressure readings, are of African descent, or are overweight.  Have your blood pressure checked: ? Every 3-5 years if you are 18-39 years of age. ? Every year if you are 40 years old or older. Diabetes Have regular diabetes screenings. This checks your fasting blood sugar level. Have the screening done:  Once every three years after age 40 if you are at a normal weight and have a low risk for diabetes.  More often and at a younger age if you are overweight or have a high risk for diabetes. What should I know about preventing infection? Hepatitis B If you have a higher risk for hepatitis B, you should be screened for this virus. Talk with your health care provider to find out if you are at risk for hepatitis B infection. Hepatitis C Testing is recommended for:  Everyone born from 1945 through 1965.  Anyone with known risk factors for hepatitis C. Sexually transmitted infections (STIs)  Get screened for STIs, including gonorrhea and chlamydia, if: ? You are sexually active and are younger than 34 years of age. ? You are older than 34 years of age and your health care provider tells you that you are at risk for this type of infection. ? Your sexual activity has changed since you were last screened, and you are at increased risk for chlamydia or gonorrhea. Ask your health care provider   if you are at risk.  Ask your health care provider about whether you are at high risk for HIV. Your health care provider may recommend a prescription medicine to help prevent HIV infection. If you choose to take medicine to prevent HIV, you should first get tested for HIV. You should then be tested every 3 months for as long as you are taking the medicine. Pregnancy  If you are about to stop having your period (premenopausal) and  you may become pregnant, seek counseling before you get pregnant.  Take 400 to 800 micrograms (mcg) of folic acid every day if you become pregnant.  Ask for birth control (contraception) if you want to prevent pregnancy. Osteoporosis and menopause Osteoporosis is a disease in which the bones lose minerals and strength with aging. This can result in bone fractures. If you are 65 years old or older, or if you are at risk for osteoporosis and fractures, ask your health care provider if you should:  Be screened for bone loss.  Take a calcium or vitamin D supplement to lower your risk of fractures.  Be given hormone replacement therapy (HRT) to treat symptoms of menopause. Follow these instructions at home: Lifestyle  Do not use any products that contain nicotine or tobacco, such as cigarettes, e-cigarettes, and chewing tobacco. If you need help quitting, ask your health care provider.  Do not use street drugs.  Do not share needles.  Ask your health care provider for help if you need support or information about quitting drugs. Alcohol use  Do not drink alcohol if: ? Your health care provider tells you not to drink. ? You are pregnant, may be pregnant, or are planning to become pregnant.  If you drink alcohol: ? Limit how much you use to 0-1 drink a day. ? Limit intake if you are breastfeeding.  Be aware of how much alcohol is in your drink. In the [place] one drink equals one 12 oz bottle of beer (355 mL), one 5 oz glass of wine (148 mL), or one 1 oz glass of hard liquor (44 mL). General instructions  Schedule regular health, dental, and eye exams.  Stay current with your vaccines.  Tell your health care provider if: ? You often feel depressed. ? You have ever been abused or do not feel safe at home. Summary  Adopting a healthy lifestyle and getting preventive care are important in promoting health and wellness.  Follow your health care provider's instructions about healthy  diet, exercising, and getting tested or screened for diseases.  Follow your health care provider's instructions on monitoring your cholesterol and blood pressure. This information is not intended to replace advice given to you by your health care provider. Make sure you discuss any questions you have with your health care provider. Document Revised: 05/06/2018 Document Reviewed: 05/06/2018 Elsevier Patient Education  2021 [employer] Inc.  

## 2020-10-02 ENCOUNTER — Encounter: Payer: Self-pay | Admitting: Family Medicine

## 2020-10-02 LAB — CYTOLOGY - PAP
Comment: NEGATIVE
Diagnosis: NEGATIVE
High risk HPV: NEGATIVE

## 2020-10-03 ENCOUNTER — Other Ambulatory Visit: Payer: Self-pay | Admitting: Family Medicine

## 2020-10-03 DIAGNOSIS — Z889 Allergy status to unspecified drugs, medicaments and biological substances status: Secondary | ICD-10-CM

## 2020-10-03 LAB — CERVICOVAGINAL ANCILLARY ONLY
Bacterial Vaginitis (gardnerella): POSITIVE — AB
Candida Glabrata: NEGATIVE
Candida Vaginitis: NEGATIVE
Chlamydia: POSITIVE — AB
Comment: NEGATIVE
Comment: NEGATIVE
Comment: NEGATIVE
Comment: NEGATIVE
Comment: NEGATIVE
Comment: NORMAL
Neisseria Gonorrhea: NEGATIVE
Trichomonas: POSITIVE — AB

## 2020-10-03 MED ORDER — DOXYCYCLINE HYCLATE 100 MG PO TABS: 100.0000 mg | ORAL_TABLET | Freq: Two times a day (BID) | ORAL | 0 refills | Status: DC

## 2020-10-04 NOTE — Telephone Encounter (Signed)
Could someone review chart for patient before scheduling an appointment.   Please advise

## 2020-10-06 ENCOUNTER — Telehealth: Payer: Self-pay

## 2020-10-06 NOTE — Telephone Encounter (Signed)
-----   Message from Hoy Register, MD sent at 10/03/2020  5:23 PM EDT ----- Can you please complete chlamydia reporting form for the health department?  Thank you

## 2020-10-06 NOTE — Telephone Encounter (Signed)
Pt was called and a VM was left informing patient to return call for lab results.

## 2020-10-10 NOTE — Telephone Encounter (Signed)
Patient has been scheduled for 11/28/20 at 2:30pm in the [facility] office.

## 2020-10-27 ENCOUNTER — Other Ambulatory Visit: Payer: Self-pay | Admitting: Family Medicine

## 2020-10-27 MED ORDER — CLOTRIMAZOLE 1 % EX CREA: 1.0000 "application " | TOPICAL_CREAM | Freq: Two times a day (BID) | CUTANEOUS | 0 refills | Status: DC

## 2020-10-27 MED ORDER — METRONIDAZOLE 500 MG PO TABS: 500.0000 mg | ORAL_TABLET | Freq: Two times a day (BID) | ORAL | 0 refills | Status: AC

## 2020-11-28 ENCOUNTER — Ambulatory Visit: Payer: Self-pay | Admitting: Allergy

## 2020-11-28 NOTE — Progress Notes (Deleted)
New [name] Note  RE: [name] MRN: [id number] DOB: Oct 10, 1985 Date of Office Visit: 11/28/2020  Consult requested by: Hoy Register, MD Primary care provider: Patient, No Pcp Per (Inactive)  Chief Complaint: No chief complaint on file.  History of Present Illness: I had the pleasure of seeing [name] for initial evaluation at the [facility] on 11/28/2020. She is a 35 y.o. female, who is referred here by [name], No Pcp Per (Inactive) for the evaluation of drug allergy.  Referral note: "Please evaluate for possible desensitization to imidazole group of medications.  She has tested positive for trichomonas vaginalis and is unable to to be treated due to allergy to medications."  Assessment and Plan: Odeth is a 35 y.o. female with: No problem-specific Assessment & Plan notes found for this encounter.  No follow-ups on file.  No orders of the defined types were placed in this encounter.  Lab Orders  No laboratory test(s) ordered today    Other allergy screening: Asthma: {Blank single:19197::"yes","no"} Rhino conjunctivitis: {Blank single:19197::"yes","no"} Food allergy: {Blank single:19197::"yes","no"} Medication allergy: {Blank single:19197::"yes","no"} Hymenoptera allergy: {Blank single:19197::"yes","no"} Urticaria: {Blank single:19197::"yes","no"} Eczema:{Blank single:19197::"yes","no"} History of recurrent infections suggestive of immunodeficency: {Blank single:19197::"yes","no"}  Diagnostics: Spirometry:  Tracings reviewed. Her effort: {Blank single:19197::"Good reproducible efforts.","It was hard to get consistent efforts and there is a question as to whether this reflects a maximal maneuver.","Poor effort, data can not be interpreted."} FVC: ***L FEV1: ***L, ***% predicted FEV1/FVC ratio: ***% Interpretation: {Blank single:19197::"Spirometry consistent with mild obstructive disease","Spirometry consistent with moderate  obstructive disease","Spirometry consistent with severe obstructive disease","Spirometry consistent with possible restrictive disease","Spirometry consistent with mixed obstructive and restrictive disease","Spirometry uninterpretable due to technique","Spirometry consistent with normal pattern","No overt abnormalities noted given today's efforts"}.  Please see scanned spirometry results for details.  Skin Testing: {Blank single:19197::"Select foods","Environmental allergy panel","Environmental allergy panel and select foods","Food allergy panel","None","Deferred due to recent antihistamines use"}. Positive test to: ***. Negative test to: ***.  Results discussed with [name]/family.   Past Medical History: [name] Active Problem List   Diagnosis Date Noted   VBAC, delivered, current hospitalization 12/10/2015   Active labor at term 12/09/2015   Past Medical History:  Diagnosis Date   Anxiety    Chlamydia    Depression    2007   Eczema    Herpes    Hx of varicella    No pertinent past medical history    Slipped cervical disc    Superficial thrombophlebitis of right leg 2015   Trichomonas    VBAC, delivered, current hospitalization 12/10/2015   Past Surgical History: Past Surgical History:  Procedure Laterality Date   CESAREAN SECTION     WISDOM TOOTH EXTRACTION     Medication List:  Current Outpatient Medications  Medication Sig Dispense Refill   cetirizine (ZYRTEC ALLERGY) 10 MG tablet Take 1 tablet (10 mg total) by mouth daily. 30 tablet 0   clotrimazole (LOTRIMIN) 1 % cream Apply 1 application topically 2 (two) times daily. 30 g 0   doxycycline (VIBRA-TABS) 100 MG tablet Take 1 tablet (100 mg total) by mouth 2 (two) times daily. 14 tablet 0   HYDROcodone-homatropine (HYCODAN) 5-1.5 MG/5ML syrup Take 5 mLs by mouth every 6 (six) hours as needed for cough. 90 mL 0   ibuprofen (ADVIL,MOTRIN) 600 MG tablet Take 1 tablet (600 mg total) by mouth every 6 (six) hours. 60 tablet 1    oseltamivir (TAMIFLU) 75 MG capsule Take 1 capsule (75 mg total) by mouth every 12 (twelve)  hours. 10 capsule 0   predniSONE (DELTASONE) 20 MG tablet Take 1 tablet (20 mg total) by mouth daily with breakfast. 5 tablet 0   Prenat w/o A Vit-FeFum-FePo-FA (CONCEPT OB) 130-92.4-1 MG CAPS Take 1 capsule by mouth daily.     ranitidine (ZANTAC) 150 MG tablet Take 150 mg by mouth 2 (two) times daily as needed for heartburn.     triamcinolone cream (KENALOG) 0.1 % Apply 1 application topically 2 (two) times daily. 80 g 1   valACYclovir (VALTREX) 500 MG tablet Take 500 mg by mouth 2 (two) times daily.  1   No current facility-administered medications for this visit.   Allergies: Allergies  Allergen Reactions   Amoxicillin Other (See Comments)    Pt states that this medication causes her skin to peel on her perineum, lower pubic area, and groin.  Has [name] had a PCN reaction causing immediate rash, facial/tongue/throat swelling, SOB or lightheadedness with hypotension: No Has [name] had a PCN reaction causing severe rash involving mucus membranes or skin necrosis: No Has [name] had a PCN reaction that required hospitalization No Has [name] had a PCN reaction occurring within the last 10 years: No If all of the above answers are "NO",   Clindamycin/Lincomycin Other (See Comments)    Pt states that this medication causes her skin to peel on her perineum, lower pubic area, and groin.   Metronidazole Other (See Comments) and Nausea And Vomiting    Pt states that this medication causes her skin to peel on her perineum, lower pubic area, and groin.   Amoxapine And Related Other (See Comments)    Pt states that this medication causes her skin to peel on her perineum, lower pubic area, and groin.   Social History: Social History   Socioeconomic History   Marital status: Single    Spouse name: Not on file   Number of children: Not on file   Years of education: Not on file   Highest education  level: Not on file  Occupational History   Not on file  Tobacco Use   Smoking status: Former    Packs/day: 1.50    Years: 10.00    Pack years: 15.00    Types: Cigarettes    Quit date: 04/23/2015    Years since quitting: 5.6   Smokeless tobacco: Never  Substance and Sexual Activity   Alcohol use: No    Alcohol/week: 0.0 standard drinks   Drug use: No   Sexual activity: Yes    Birth control/protection: None  Other Topics Concern   Not on file  Social History Narrative   Not on file   Social Determinants of Health   Financial Resource Strain: Not on file  Food Insecurity: Not on file  Transportation Needs: Not on file  Physical Activity: Not on file  Stress: Not on file  Social Connections: Not on file   Lives in a ***. Smoking: *** Occupation: ***  Environmental HistorySurveyor, minerals in the house: Copywriter, advertising in the family room: {Blank single:19197::"yes","no"} Carpet in the bedroom: {Blank single:19197::"yes","no"} Heating: {Blank single:19197::"electric","gas","heat pump"} Cooling: {Blank single:19197::"central","window","heat pump"} Pet: {Blank single:19197::"yes ***","no"}  Family History: Family History  Adopted: Yes  Problem Relation Age of Onset   Other Neg Hx    Problem                               Relation Asthma                                   ***  Eczema                                *** Food allergy                          *** Allergic rhino conjunctivitis     ***  Review of Systems  Constitutional:  Negative for appetite change, chills, fever and unexpected weight change.  HENT:  Negative for congestion and rhinorrhea.   Eyes:  Negative for itching.  Respiratory:  Negative for cough, chest tightness, shortness of breath and wheezing.   Cardiovascular:  Negative for chest pain.  Gastrointestinal:  Negative for abdominal pain.  Genitourinary:  Negative for difficulty urinating.  Skin:  Negative for rash.   Neurological:  Negative for headaches.   Objective: There were no vitals taken for this visit. There is no height or weight on file to calculate BMI. Physical Exam Vitals and nursing note reviewed.  Constitutional:      Appearance: Normal appearance. She is well-developed.  HENT:     Head: Normocephalic and atraumatic.     Right Ear: External ear normal.     Left Ear: External ear normal.     Nose: Nose normal.     Mouth/Throat:     Mouth: Mucous membranes are moist.     Pharynx: Oropharynx is clear.  Eyes:     Conjunctiva/sclera: Conjunctivae normal.  Cardiovascular:     Rate and Rhythm: Normal rate and regular rhythm.     Heart sounds: Normal heart sounds. No murmur heard.   No friction rub. No gallop.  Pulmonary:     Effort: Pulmonary effort is normal.     Breath sounds: Normal breath sounds. No wheezing, rhonchi or rales.  Abdominal:     Palpations: Abdomen is soft.  Musculoskeletal:     Cervical back: Neck supple.  Skin:    General: Skin is warm.     Findings: No rash.  Neurological:     Mental Status: She is alert and oriented to person, place, and time.  Psychiatric:        Behavior: Behavior normal.   The plan was reviewed with the [name]/family, and all questions/concerned were addressed.  It was my pleasure to see Belinda today and participate in her care. Please feel free to contact me with any questions or concerns.  Sincerely,  Wyline Mood, DO Allergy & Immunology  [facility] office: [phone] [facility] office: [phone]

## 2020-12-13 ENCOUNTER — Encounter: Payer: Self-pay | Admitting: Family Medicine

## 2020-12-13 ENCOUNTER — Other Ambulatory Visit (HOSPITAL_COMMUNITY)
Admission: RE | Admit: 2020-12-13 | Discharge: 2020-12-13 | Disposition: A | Discharge status: Home / Self Care | Payer: Medicaid Other | Source: Ambulatory Visit | Attending: Family Medicine | Admitting: Family Medicine

## 2020-12-13 ENCOUNTER — Ambulatory Visit: Payer: Medicaid Other | Attending: Family Medicine | Admitting: Family Medicine

## 2020-12-13 ENCOUNTER — Other Ambulatory Visit: Payer: Self-pay

## 2020-12-13 VITALS — BP 106/68 | HR 96 | Wt 179.0 lb

## 2020-12-13 DIAGNOSIS — L309 Dermatitis, unspecified: Secondary | ICD-10-CM | POA: Diagnosis not present

## 2020-12-13 DIAGNOSIS — A599 Trichomoniasis, unspecified: Secondary | ICD-10-CM | POA: Insufficient documentation

## 2020-12-13 DIAGNOSIS — Z881 Allergy status to other antibiotic agents status: Secondary | ICD-10-CM | POA: Diagnosis not present

## 2020-12-13 DIAGNOSIS — Z749 Problem related to care provider dependency, unspecified: Secondary | ICD-10-CM | POA: Diagnosis not present

## 2020-12-13 DIAGNOSIS — L308 Other specified dermatitis: Secondary | ICD-10-CM

## 2020-12-13 DIAGNOSIS — Z880 Allergy status to penicillin: Secondary | ICD-10-CM | POA: Insufficient documentation

## 2020-12-13 DIAGNOSIS — A749 Chlamydial infection, unspecified: Secondary | ICD-10-CM | POA: Diagnosis not present

## 2020-12-13 NOTE — Patient Instructions (Signed)
Safe Sex Practicing safe sex means taking steps before and during sex to reduce your risk of: Getting an STI (sexually transmitted infection). Giving your partner an STI. Unwanted or unplanned pregnancy. How to practice safe sex Ways you can practice safe sex  Limit your sexual partners to only one partner who is having sex with only you. Avoid using alcohol and drugs before having sex. Alcohol and drugs can affect your judgment. Before having sex with a new partner: Talk to your partner about past partners, past STIs, and drug use. Get screened for STIs and discuss the results with your partner. Ask your partner to get screened too. Check your body regularly for sores, blisters, rashes, or unusual discharge. If you notice any of these problems, visit your health care provider. Avoid sexual contact if you have symptoms of an infection or you are being treated for an STI. While having sex, use a condom. Make sure to: Use a condom every time you have vaginal, oral, or anal sex. Both females and males should wear condoms during oral sex. Keep condoms in place from the beginning to the end of sexual activity. Use a latex condom, if possible. Latex condoms offer the best protection. Use only water-based lubricants with a condom. Using petroleum-based lubricants or oils will weaken the condom and increase the chance that it will break. Ways your health care provider can help you practice safe sex  See your health care provider for regular screenings, exams, and tests for STIs. Talk with your health care provider about what kind of birth control (contraception) is best for you. Get vaccinated against hepatitis B and human papillomavirus (HPV). If you are at risk of being infected with HIV (human immunodeficiency virus), talk with your health care provider about taking a prescription medicine to prevent HIV infection. You are at risk for HIV if you: Are a man who has sex with other men. Are  sexually active with more than one partner. Take drugs by injection. Have a sex partner who has HIV. Have unprotected sex. Have sex with someone who has sex with both men and women. Have had an STI. Follow these instructions at home: Take over-the-counter and prescription medicines only as told by your health care provider. Keep all follow-up visits. This is important. Where to find more information Centers for Disease Control and Prevention: www.cdc.gov Planned Parenthood: www.plannedparenthood.org Office on Women's Health: www.womenshealth.gov Summary Practicing safe sex means taking steps before and during sex to reduce your risk getting an STI, giving your partner an STI, and having an unwanted or unplanned pregnancy. Before having sex with a new partner, talk to your partner about past partners, past STIs, and drug use. Use a condom every time you have vaginal, oral, or anal sex. Both females and males should wear condoms during oral sex. Check your body regularly for sores, blisters, rashes, or unusual discharge. If you notice any of these problems, visit your health care provider. See your health care provider for regular screenings, exams, and tests for STIs. This information is not intended to replace advice given to you by your health care provider. Make sure you discuss any questions you have with your health care provider. Document Revised: 10/18/2019 Document Reviewed: 10/18/2019 Elsevier Patient Education  2022 Elsevier Inc.  

## 2020-12-13 NOTE — Progress Notes (Signed)
Subjective:  Patient ID: [name], female    DOB: Jun 28, 1985  Age: 35 y.o. MRN: [id number]  CC: [id number]   HPI [name] is a 35 year old female with a history of eczema who presents today for repeat testing after being diagnosed with chlamydia and trichomonas.  Interval History: Treated with azithromycin and metronidazole.  Her chart reveals a history of allergies to metronidazole and she had been referred to an allergist for desensitization.  She had insisted on receiving the metronidazole and requested an antifungal would improve her tolerance of metronidazole. She did not have any reaction to the medication. She is due for retesting for Trichomonas and chlamydia. She is asymptomatic.  Unsure if her partner got treated but she is currently inactive with those partners.  She is heterosexual. Her appointment with the allergist is on the 01/05/21  For her eczema she use triamcinolone which improved some of her rash. Past Medical History:  Diagnosis Date   Anxiety    Chlamydia    Depression    2007   Eczema    Herpes    Hx of varicella    No pertinent past medical history    Slipped cervical disc    Superficial thrombophlebitis of right leg 2015   Trichomonas    VBAC, delivered, current hospitalization 12/10/2015    Past Surgical History:  Procedure Laterality Date   CESAREAN SECTION     WISDOM TOOTH EXTRACTION      Family History  Adopted: Yes  Problem Relation Age of Onset   Other Neg Hx     Allergies  Allergen Reactions   Amoxicillin Other (See Comments)    Pt states that this medication causes her skin to peel on her perineum, lower pubic area, and groin.  Has patient had a PCN reaction causing immediate rash, facial/tongue/throat swelling, SOB or lightheadedness with hypotension: No Has patient had a PCN reaction causing severe rash involving mucus membranes or skin necrosis: No Has patient had a PCN reaction that required hospitalization No Has  patient had a PCN reaction occurring within the last 10 years: No If all of the above answers are "NO",   Clindamycin/Lincomycin Other (See Comments)    Pt states that this medication causes her skin to peel on her perineum, lower pubic area, and groin.   Metronidazole Other (See Comments) and Nausea And Vomiting    Pt states that this medication causes her skin to peel on her perineum, lower pubic area, and groin.   Amoxapine And Related Other (See Comments)    Pt states that this medication causes her skin to peel on her perineum, lower pubic area, and groin.    Outpatient Medications Prior to Visit  Medication Sig Dispense Refill   cetirizine (ZYRTEC ALLERGY) 10 MG tablet Take 1 tablet (10 mg total) by mouth daily. (Patient not taking: Reported on 12/13/2020) 30 tablet 0   clotrimazole (LOTRIMIN) 1 % cream Apply 1 application topically 2 (two) times daily. (Patient not taking: Reported on 12/13/2020) 30 g 0   doxycycline (VIBRA-TABS) 100 MG tablet Take 1 tablet (100 mg total) by mouth 2 (two) times daily. (Patient not taking: Reported on 12/13/2020) 14 tablet 0   HYDROcodone-homatropine (HYCODAN) 5-1.5 MG/5ML syrup Take 5 mLs by mouth every 6 (six) hours as needed for cough. 90 mL 0   predniSONE (DELTASONE) 20 MG tablet Take 1 tablet (20 mg total) by mouth daily with breakfast. (Patient not taking: Reported on 12/13/2020) 5 tablet 0  ranitidine (ZANTAC) 150 MG tablet Take 150 mg by mouth 2 (two) times daily as needed for heartburn.     triamcinolone cream (KENALOG) 0.1 % Apply 1 application topically 2 (two) times daily. (Patient not taking: Reported on 12/13/2020) 80 g 1   valACYclovir (VALTREX) 500 MG tablet Take 500 mg by mouth 2 (two) times daily.  1   ibuprofen (ADVIL,MOTRIN) 600 MG tablet Take 1 tablet (600 mg total) by mouth every 6 (six) hours. 60 tablet 1   oseltamivir (TAMIFLU) 75 MG capsule Take 1 capsule (75 mg total) by mouth every 12 (twelve) hours. 10 capsule 0   Prenat w/o A  Vit-FeFum-FePo-FA (CONCEPT OB) 130-92.4-1 MG CAPS Take 1 capsule by mouth daily.     No facility-administered medications prior to visit.     ROS Review of Systems  Constitutional:  Negative for activity change, appetite change and fatigue.  HENT:  Negative for congestion, sinus pressure and sore throat.   Eyes:  Negative for visual disturbance.  Respiratory:  Negative for cough, chest tightness, shortness of breath and wheezing.   Cardiovascular:  Negative for chest pain and palpitations.  Gastrointestinal:  Negative for abdominal distention, abdominal pain and constipation.  Endocrine: Negative for polydipsia.  Genitourinary:  Negative for dysuria and frequency.  Musculoskeletal:  Negative for arthralgias and back pain.  Skin:  Negative for rash.  Neurological:  Negative for tremors, light-headedness and numbness.  Hematological:  Does not bruise/bleed easily.  Psychiatric/Behavioral:  Negative for agitation and behavioral problems.    Objective:  BP 106/68   Pulse 96   Wt 179 lb (81.2 kg)   SpO2 100%   BMI 29.79 kg/m   BP/Weight 12/13/2020 09/28/2020 08/10/2020  Systolic BP 106 100 126  Diastolic BP 68 67 81  Wt. (Lbs) 179 179.8 -  BMI 29.79 29.92 -      Physical Exam Constitutional:      Appearance: She is well-developed.  Neck:     Vascular: No JVD.  Cardiovascular:     Rate and Rhythm: Normal rate.     Heart sounds: Normal heart sounds. No murmur heard. Pulmonary:     Effort: Pulmonary effort is normal.     Breath sounds: Normal breath sounds. No wheezing or rales.  Chest:     Chest wall: No tenderness.  Abdominal:     General: Bowel sounds are normal. There is no distension.     Palpations: Abdomen is soft. There is no mass.     Tenderness: There is no abdominal tenderness.  Musculoskeletal:        General: Normal range of motion.     Right lower leg: No edema.     Left lower leg: No edema.  Neurological:     Mental Status: She is alert and oriented to  person, place, and time.  Psychiatric:        Mood and Affect: Mood normal.    CMP Latest Ref Rng & Units 11/13/2013 06/22/2013 01/08/2013  Glucose 70 - 99 mg/dL 87 92 92  BUN 6 - 23 mg/dL 9 8 8   Creatinine 0.50 - 1.10 mg/dL 1.61 0.96  Sodium 137 - 147 mEq/L 139 140 135  Potassium 3.7 - 5.3 mEq/L 4.5 4.4 3.5  Chloride 96 - 112 mEq/L 102 105 99  CO2 19 - 32 mEq/L 24 25 27   Calcium 8.4 - 10.5 mg/dL 9.3 9.1 9.7  Total Protein 6.0 - 8.3 g/dL 6.7 6.8 7.8  Total Bilirubin 0.3 - 1.2 mg/dL  0.2(L) <0.2(L) 0.3  Alkaline Phos 39 - 117 U/L 46 48 59  AST 0 - 37 U/L 11 14 13   ALT 0 - 35 U/L 9 12 13     Lipid Panel  No results found for: CHOL, TRIG, HDL, CHOLHDL, VLDL, LDLCALC, LDLDIRECT  CBC    Component Value Date/Time   WBC 19.1 (H) 12/10/2015 0601   RBC 2.98 (L) 12/10/2015 0601   HGB 9.1 (L) 12/10/2015 0601   HCT 27.0 (L) 12/10/2015 0601   PLT 217 12/10/2015 0601   MCV 90.6 12/10/2015 0601   MCV 94.0 05/12/2014 1630   MCH 30.5 12/10/2015 0601   MCHC 33.7 12/10/2015 0601   RDW 14.0 12/10/2015 0601   LYMPHSABS 4.2 (H) 11/13/2013 1327   MONOABS 0.4 11/13/2013 1327   EOSABS 0.2 11/13/2013 1327   BASOSABS 0.0 11/13/2013 1327    No results found for: HGBA1C  Assessment & Plan:  1. Chlamydia Treated We will retest Counseled on safe sexual practices - Cervicovaginal ancillary only  2. Trichomoniasis Treated Will retest Keep appointment with allergist due to history of allergy to metronidazole - Cervicovaginal ancillary only  3. Other eczema Controlled on triamcinolone   No orders of the defined types were placed in this encounter.   Follow-up: Return if symptoms worsen or fail to improve.       11/15/2013, MD, FAAFP. [facility] [place], [facility] [place]   12/13/2020, 4:00 PM

## 2020-12-15 ENCOUNTER — Encounter: Payer: Self-pay | Admitting: Family Medicine

## 2020-12-15 LAB — CERVICOVAGINAL ANCILLARY ONLY
Bacterial Vaginitis (gardnerella): POSITIVE — AB
Candida Glabrata: NEGATIVE
Candida Vaginitis: POSITIVE — AB
Chlamydia: NEGATIVE
Comment: NEGATIVE
Comment: NEGATIVE
Comment: NEGATIVE
Comment: NEGATIVE
Comment: NEGATIVE
Comment: NORMAL
Neisseria Gonorrhea: NEGATIVE
Trichomonas: NEGATIVE

## 2020-12-16 ENCOUNTER — Other Ambulatory Visit: Payer: Self-pay | Admitting: Family Medicine

## 2020-12-16 MED ORDER — FLUCONAZOLE 150 MG PO TABS: 150.0000 mg | ORAL_TABLET | Freq: Once | ORAL | 0 refills | Status: AC

## 2020-12-16 MED ORDER — METRONIDAZOLE 500 MG PO TABS: 500.0000 mg | ORAL_TABLET | Freq: Two times a day (BID) | ORAL | 0 refills | Status: AC

## 2021-01-10 NOTE — Progress Notes (Signed)
New Patient Note  RE: [name] MRN: [id number] DOB: 02/06/1986 Date of Office Visit: 01/11/2021  Consult requested by: Hoy RegisterNewlin, Enobong, MD Primary care provider: Hoy RegisterNewlin, Enobong, MD  Chief Complaint: Allergic Reaction (Had Trichomoniasis and was put on a medication for it and had an allergic reaction wanted to be tested incase this happened again)  History of Present Illness: I had the pleasure of seeing [name] for initial evaluation at the [facility] on 01/11/2021. She is a 35 y.o. female, who is referred here by Hoy RegisterNewlin, Enobong, MD for the evaluation of drug allergy.  Patient has multiple antibiotic allergies listed in her chart.   Patient was treated with metronidazole for trichomonas over 10 years ago. At the same time she was given a suppository for a yeast infection.  She believes she took flagyl for 2-3 does and she developed some skin irritation and skin peeling of the vaginal area only. Denies any other associated symptoms. She thinks this was due to her yeast infection and not due to the flagyl.  She was diagnosed with chlamydia and trichomonas in July 2022. Patient took metronidazole 500mg  1/2 tablet twice a day for 14 days instead of 500mg  BID x 7 days with no issues and the trichomonas resolved.  Amoxicillin and clindamycin.  Patient does not recall specifics about this antibiotics but she thinks she had the same issue with the skin on her vaginal area. Denies any mucosal involvement.   Referral note: "Please evaluate for possible desensitization to imidazole group of medications.  She has tested positive for trichomonas vaginalis and is unable to be treated due to allergy to medications."  Assessment and Plan: [name] is a 35 y.o. female with: Adverse effect of other drugs, medicaments and biological substances, subsequent encounter Patient was treated with Flagyl for trichomonas over 10 years ago.  She was also given a suppository  for yeast infection and developed some skin irritation and skin peeling only around the vaginal area.  Most recently she was diagnosed with chlamydia and trichomonas in July 2022 and took Flagyl 500mg  1/2 tablet twice a day for 14 days with no issues. Amoxicillin and clindamycin gave similar issues with irritation on the vaginal area only but unsure of specifics. No indication for any testing or drug challenge for flagyl as patient tolerated it last month with no issues - basically she did her own drug challenge at home.   Skin prick and intradermal testing today to penicillin-G, PrePen were negative. Patient received penicillin V 5mg  and then 250mg  with no clinical reactions.  Will remove penicillin allergy from drug allergy list.  Discussed that her risk of re-developing penicillin and flagyl allergy is the same as the general populations and should not avoid it if she needs to be treated with penicillin or flagyl in the future.   If interested we can schedule drug challenge to clindamycin next. You must be off antihistamines for 3-5 days before. Must be in good health and not ill. Plan on being in the office for 2-3 hours and must bring in the drug you want to do the oral challenge for - will send in prescription to pick up a few days before. You must call to schedule an appointment and specify it's for a drug challenge.   Dyshidrotic eczema She most likely has dyshidrotic eczema on her hands. See below for proper skin care. Use over the counter antihistamines such as Zyrtec (cetirizine), Claritin (loratadine), Allegra (fexofenadine), or Xyzal (levocetirizine)  daily as needed.  Use Eucrisa (crisaborole) 2% ointment twice a day on mild rash flares on the face and body. This is a non-steroid ointment. Sample given. If this works well, let us know and will send in a prescription.   Return if symptoms worsen or fail to improve.  No orders of the defined types were placed in this encounter.  Lab  Orders  No laboratory test(s) ordered today   Other allergy screening: Asthma: no Rhino conjunctivitis: not sure Food allergy: no Hymenoptera allergy: no Urticaria: no Eczema:yes History of recurrent infections suggestive of immunodeficency: no  Diagnostics: Skin Testing:  Skin prick and intradermal testing today to penicillin-G, PrePen were negative. Results discussed with patient/family.  Oral Challenge - 01/11/21 1100     Challenge Food/Drug Penicillin    Lot #  if Applicable [id number]    Food/Drug provided by [employer]    BP 104/62    Pulse 82    Respirations 17    Lungs 99%    Time 1111    Dose 0.1 ml - 5mg     Time 1128    Dose 32ml - 250mg     BP 104/66    Pulse 72    Respirations 16    Lungs 100%             Penicillin - 01/11/21 0953     Manufacturer Pre pen [employer]    Lot #    Location Arm    Number of allergen test 4    Time Testing Placed [phone]    Control SPT Negative    Histamine SPT 2+    Pre-Pen Puncture Negative    Penicillin-G 5000 u/ml SPT Negative    Time Testing Placed 1025    Control Intradermal Negative    Pre-Pen Intradermal Negative    Time Testing Placed 1025    Penicillin-G 500u/ml Intradermal Negative    Time Testing Placed 1042    Penicillin-G 5000 u/ml Intradermal Negative             Past Medical History: Patient Active Problem List   Diagnosis Date Noted   Adverse effect of other drugs, medicaments and biological substances, subsequent encounter 01/11/2021   Penicillin allergy 01/11/2021   Dyshidrotic eczema 01/11/2021   VBAC, delivered, current hospitalization 12/10/2015   Active labor at term 12/09/2015   Past Medical History:  Diagnosis Date   Anxiety    Chlamydia    Depression    2007   Eczema    Herpes    Hx of varicella    No pertinent past medical history    Slipped cervical disc    Superficial thrombophlebitis of right leg 2015   Trichomonas    VBAC, delivered, current hospitalization  12/10/2015   Past Surgical History: Past Surgical History:  Procedure Laterality Date   CESAREAN SECTION     WISDOM TOOTH EXTRACTION     Medication List:  No current outpatient medications on file.   No current facility-administered medications for this visit.   Allergies: Allergies  Allergen Reactions   Clindamycin/Lincomycin Other (See Comments)    Pt states that this medication causes her skin to peel on her perineum, lower pubic area, and groin.   Amoxapine And Related Other (See Comments)    Pt states that this medication causes her skin to peel on her perineum, lower pubic area, and groin.   Social History: Social History   Socioeconomic History   Marital status: Single    Spouse  name: Not on file   Number of children: Not on file   Years of education: Not on file   Highest education level: Not on file  Occupational History   Not on file  Tobacco Use   Smoking status: Former    Types: Cigarettes    Quit date: 04/23/2015    Years since quitting: 5.7   Smokeless tobacco: Never  Vaping Use   Vaping Use: Never used  Substance and Sexual Activity   Alcohol use: Yes    Comment: occ   Drug use: No   Sexual activity: Yes    Birth control/protection: None  Other Topics Concern   Not on file  Social History Narrative   Not on file   Social Determinants of Health   Financial Resource Strain: Not on file  Food Insecurity: Not on file  Transportation Needs: Not on file  Physical Activity: Not on file  Stress: Not on file  Social Connections: Not on file   Lives in a apartment. Smoking: 1 pack per day Occupation: not employed  Environmental HistorySurveyor, minerals in the house: no Engineer, civil (consulting) in the family room: no Carpet in the bedroom: no Heating: gas Cooling:  none Pet: no  Family History: Family History  Adopted: Yes  Problem Relation Age of Onset   Other Neg Hx    Allergic rhinitis Neg Hx    Angioedema Neg Hx    Asthma Neg Hx    Atopy Neg Hx     Eczema Neg Hx    Immunodeficiency Neg Hx    Urticaria Neg Hx    Review of Systems  Constitutional:  Negative for appetite change, chills, fever and unexpected weight change.  HENT:  Negative for congestion and rhinorrhea.   Eyes:  Negative for itching.  Respiratory:  Negative for cough, chest tightness, shortness of breath and wheezing.   Cardiovascular:  Negative for chest pain.  Gastrointestinal:  Negative for abdominal pain.  Genitourinary:  Negative for difficulty urinating.  Skin:  Positive for rash.  Neurological:  Negative for headaches.   Objective: BP 104/62   Pulse 82   Resp 17   SpO2 99%  There is no height or weight on file to calculate BMI. Physical Exam Vitals and nursing note reviewed.  Constitutional:      Appearance: Normal appearance. She is well-developed.  HENT:     Head: Normocephalic and atraumatic.     Right Ear: Tympanic membrane and external ear normal.     Left Ear: Tympanic membrane and external ear normal.     Nose: Nose normal.     Mouth/Throat:     Mouth: Mucous membranes are moist.     Pharynx: Oropharynx is clear.  Eyes:     Conjunctiva/sclera: Conjunctivae normal.  Cardiovascular:     Rate and Rhythm: Normal rate and regular rhythm.     Heart sounds: Normal heart sounds. No murmur heard.   No friction rub. No gallop.  Pulmonary:     Effort: Pulmonary effort is normal.     Breath sounds: Normal breath sounds. No wheezing, rhonchi or rales.  Musculoskeletal:     Cervical back: Neck supple.  Skin:    General: Skin is warm and dry.     Comments: Dry hands bilaterally  Neurological:     Mental Status: She is alert and oriented to person, place, and time.  Psychiatric:        Behavior: Behavior normal.  The plan was reviewed with the patient/family, and  all questions/concerned were addressed.  It was my pleasure to see [name] today and participate in her care. Please feel free to contact me with any questions or  concerns.  Sincerely,  Wyline Mood, DO Allergy & Immunology  [facility] office: [phone] [facility] office: [phone]

## 2021-01-11 ENCOUNTER — Other Ambulatory Visit: Payer: Self-pay

## 2021-01-11 ENCOUNTER — Encounter: Payer: Self-pay | Admitting: Allergy

## 2021-01-11 ENCOUNTER — Ambulatory Visit (INDEPENDENT_AMBULATORY_CARE_PROVIDER_SITE_OTHER): Payer: Medicaid Other | Admitting: Allergy

## 2021-01-11 VITALS — BP 104/62 | HR 82 | Temp 97.9°F | Resp 17 | Ht 66.0 in | Wt 175.0 lb

## 2021-01-11 DIAGNOSIS — Z880 Allergy status to penicillin: Secondary | ICD-10-CM | POA: Diagnosis not present

## 2021-01-11 DIAGNOSIS — T50995D Adverse effect of other drugs, medicaments and biological substances, subsequent encounter: Secondary | ICD-10-CM | POA: Diagnosis not present

## 2021-01-11 DIAGNOSIS — L301 Dyshidrosis [pompholyx]: Secondary | ICD-10-CM

## 2021-01-11 NOTE — Assessment & Plan Note (Signed)
Patient was treated with Flagyl for trichomonas over 10 years ago.  She was also given a suppository for yeast infection and developed some skin irritation and skin peeling only around the vaginal area.  Most recently she was diagnosed with chlamydia and trichomonas in July 2022 and took Flagyl 500mg  1/2 tablet twice a day for 14 days with no issues. Amoxicillin and clindamycin gave similar issues with irritation on the vaginal area only but unsure of specifics.  No indication for any testing or drug challenge for flagyl as patient tolerated it last month with no issues - basically she did her own drug challenge at home.    Skin prick and intradermal testing today to penicillin-G, PrePen were negative.  Patient received penicillin V 5mg  and then 250mg  with no clinical reactions.   Will remove penicillin allergy from drug allergy list.   Discussed that her risk of re-developing penicillin and flagyl allergy is the same as the general populations and should not avoid it if she needs to be treated with penicillin or flagyl in the future.   . If interested we can schedule drug challenge to clindamycin next. You must be off antihistamines for 3-5 days before. Must be in good health and not ill. Plan on being in the office for 2-3 hours and must bring in the drug you want to do the oral challenge for - will send in prescription to pick up a few days before. You must call to schedule an appointment and specify it's for a drug challenge.

## 2021-01-11 NOTE — Assessment & Plan Note (Signed)
She most likely has dyshidrotic eczema on her hands. . See below for proper skin care. . Use over the counter antihistamines such as Zyrtec (cetirizine), Claritin (loratadine), Allegra (fexofenadine), or Xyzal (levocetirizine) daily as needed.   Use Eucrisa (crisaborole) 2% ointment twice a day on mild rash flares on the face and body. This is a non-steroid ointment. Sample given.  If this works well, let us know and will send in a prescription.

## 2021-01-11 NOTE — Patient Instructions (Addendum)
No indication for any testing or drug challenge for flagyl as patient tolerated it last month with no issues - basically she did her own drug challenge at home.   Skin prick and intradermal testing today to penicillin-G, PrePen were negative. Patient received penicillin V 5mg  and then 250mg  with no clinical reactions.  Will remove penicillin allergy from drug allergy list.  Discussed that her risk of re-developing penicillin and flagyl allergy is the same as the general populations and should not avoid it if she needs to be treated with penicillin or flagyl in the future.   If interested we can schedule drug challenge to clindamycin next. You must be off antihistamines for 3-5 days before. Must be in good health and not ill. Plan on being in the office for 2-3 hours and must bring in the drug you want to do the oral challenge for - will send in prescription to pick up a few days before. You must call to schedule an appointment and specify it's for a drug challenge.   Hands: See below for proper skin care. Use over the counter antihistamines such as Zyrtec (cetirizine), Claritin (loratadine), Allegra (fexofenadine), or Xyzal (levocetirizine) daily as needed.  Use Eucrisa (crisaborole) 2% ointment twice a day on mild rash flares on the face and body. This is a non-steroid ointment. Sample given. If this works well, let know and will send in a prescription.  If it burns, place the medication in the refrigerator.  Apply a thin layer of moisturizer and then apply the Eucrisa on top of it.  Follow up as needed.  Skin care recommendations  Bath time: Always use lukewarm water. AVOID very hot or cold water. Keep bathing time to 5-10 minutes. Do NOT use bubble bath. Use a mild soap and use just enough to wash the dirty areas. Do NOT scrub skin vigorously.  After bathing, pat dry your skin with a towel. Do NOT rub or scrub the skin.  Moisturizers and prescriptions:  ALWAYS apply moisturizers  immediately after bathing (within 3 minutes). This helps to lock-in moisture. Use the moisturizer several times a day over the whole body. Good summer moisturizers include: Aveeno, CeraVe, Cetaphil. Good winter moisturizers include: Aquaphor, Vaseline, Cerave, Cetaphil, Eucerin, Vanicream. When using moisturizers along with medications, the moisturizer should be applied about one hour after applying the medication to prevent diluting effect of the medication or moisturize around where you applied the medications. When not using medications, the moisturizer can be continued twice daily as maintenance.  Laundry and clothing: Avoid laundry products with added color or perfumes. Use unscented hypo-allergenic laundry products such as Tide free, Cheer free & gentle, and All free and clear.  If the skin still seems dry or sensitive, you can try double-rinsing the clothes. Avoid tight or scratchy clothing such as wool. Do not use fabric softeners or dyer sheets.

## 2023-04-22 LAB — OB RESULTS CONSOLE ANTIBODY SCREEN: Antibody Screen: NEGATIVE

## 2023-04-22 LAB — OB RESULTS CONSOLE HIV ANTIBODY (ROUTINE TESTING): HIV: NONREACTIVE

## 2023-04-22 LAB — OB RESULTS CONSOLE GC/CHLAMYDIA
Chlamydia: NEGATIVE
Neisseria Gonorrhea: NEGATIVE

## 2023-04-22 LAB — OB RESULTS CONSOLE HEPATITIS B SURFACE ANTIGEN: Hepatitis B Surface Ag: NEGATIVE

## 2023-04-22 LAB — HEPATITIS C ANTIBODY: HCV Ab: NEGATIVE

## 2023-04-22 LAB — OB RESULTS CONSOLE RUBELLA ANTIBODY, IGM: Rubella: IMMUNE

## 2023-04-22 LAB — OB RESULTS CONSOLE RPR: RPR: NONREACTIVE

## 2023-05-28 NOTE — L&D Delivery Note (Signed)
 Delivery Note    Patient Name: [name] DOB: 08/11/1985 MRN: [id number]  Date of admission: 11/15/2023 Delivering MD: Yudith Norlander , Jada Fass Date of delivery: 11/16/2023 Type of delivery: SVD  Newborn Data: Live born female  Birth Weight:   APGAR: 8, 9  Newborn Delivery   Birth date/time: 11/15/2023 23:58:39 Delivery type: Vaginal, Spontaneous     [name], 38 y.o., @ [redacted]w[redacted]d,  [phone], who was admitted for IOL for AMA, gb+ and adequately treated during labor, developed preeclampsia w/o SF, denies HA, RUQ pain or vision changes, PCR was 0.37. I was called to the room when she progressed 2+ station in the second stage of labor.  She pushed for 15 mins hours/min.  She delivered a viable infant, cephalic and restituted to the LOA position over an intact perineum.  A nuchal cord   was identified, very loose and reduced. The baby was placed on maternal abdomen while initial step of NRP were perfmored (Dry, Stimulated, and warmed). Hat placed on baby for thermoregulation. Delayed cord clamping was performed for 2 minutes.  Cord double clamped and cut.  Cord cut by FOB. Apgar scores were 8 and 9. Prophylactic Pitocin  was started in the third stage of labor for active management as well . The placenta delivered spontaneously, shultz, with a 3 vessel cord and was sent to LD.  Inspection revealed none. An examination of the vaginal vault and cervix was free from lacerations. The uterus was firm, bleeding stable.   Placenta and umbilical artery blood gas were not sent.  There were no complications during the procedure.  Mom and baby skin to skin following delivery. Left in stable condition. BP (!) 142/73   Pulse 89   Temp 98.3 F (36.8 C) (Oral)   Resp 18   Ht 5' 5 (1.651 m)   Wt 102.8 kg   LMP 02/13/2023 (Exact Date)   SpO2 100%   BMI 37.71 kg/m  Will monitor BP, if continues to be >140/90 will start procardia, recheck cbc, cmp in morning.   Maternal Info: Anesthesia:  Epidural Episiotomy: no Lacerations:  no Suture Repair: no Est. Blood Loss (mL):   Newborn Info:  Baby Sex: female Circumcision: in pt desired  APGAR (1 MIN): 8  APGAR (5 MINS):  9 APGAR (10 MINS):     Mom to postpartum.  Baby to Couplet care / Skin to Skin.  Delivery Report:    Review the Delivery Report for details.   [name]  CNM, FNP-C, PMHNP-BC  [id number]  [place], [place] [address]  Cell: [phone]  Office Phone: [phone] Fax: [phone] 11/16/2023  12:20 AM

## 2023-07-23 ENCOUNTER — Other Ambulatory Visit: Payer: Self-pay | Admitting: Obstetrics and Gynecology

## 2023-07-23 ENCOUNTER — Other Ambulatory Visit: Payer: Self-pay

## 2023-07-23 DIAGNOSIS — O09522 Supervision of elderly multigravida, second trimester: Secondary | ICD-10-CM

## 2023-07-29 ENCOUNTER — Encounter: Payer: Self-pay | Admitting: *Deleted

## 2023-07-29 DIAGNOSIS — Z98891 History of uterine scar from previous surgery: Secondary | ICD-10-CM | POA: Insufficient documentation

## 2023-07-29 DIAGNOSIS — O09522 Supervision of elderly multigravida, second trimester: Secondary | ICD-10-CM | POA: Insufficient documentation

## 2023-07-29 DIAGNOSIS — O3503X0 Maternal care for (suspected) central nervous system malformation or damage in fetus, choroid plexus cysts, not applicable or unspecified: Secondary | ICD-10-CM | POA: Insufficient documentation

## 2023-07-29 DIAGNOSIS — D259 Leiomyoma of uterus, unspecified: Secondary | ICD-10-CM | POA: Insufficient documentation

## 2023-07-29 DIAGNOSIS — O34219 Maternal care for unspecified type scar from previous cesarean delivery: Secondary | ICD-10-CM | POA: Insufficient documentation

## 2023-08-01 ENCOUNTER — Other Ambulatory Visit: Payer: Self-pay | Admitting: *Deleted

## 2023-08-01 ENCOUNTER — Ambulatory Visit (HOSPITAL_BASED_OUTPATIENT_CLINIC_OR_DEPARTMENT_OTHER): Payer: Medicaid Other | Admitting: Obstetrics

## 2023-08-01 ENCOUNTER — Ambulatory Visit: Payer: Medicaid Other | Attending: Obstetrics and Gynecology

## 2023-08-01 ENCOUNTER — Ambulatory Visit: Payer: Medicaid Other | Admitting: *Deleted

## 2023-08-01 VITALS — BP 116/66 | HR 83

## 2023-08-01 DIAGNOSIS — O34219 Maternal care for unspecified type scar from previous cesarean delivery: Secondary | ICD-10-CM | POA: Insufficient documentation

## 2023-08-01 DIAGNOSIS — Z98891 History of uterine scar from previous surgery: Secondary | ICD-10-CM | POA: Insufficient documentation

## 2023-08-01 DIAGNOSIS — O09522 Supervision of elderly multigravida, second trimester: Secondary | ICD-10-CM

## 2023-08-01 DIAGNOSIS — O3412 Maternal care for benign tumor of corpus uteri, second trimester: Secondary | ICD-10-CM | POA: Insufficient documentation

## 2023-08-01 DIAGNOSIS — D259 Leiomyoma of uterus, unspecified: Secondary | ICD-10-CM

## 2023-08-01 DIAGNOSIS — O3503X0 Maternal care for (suspected) central nervous system malformation or damage in fetus, choroid plexus cysts, not applicable or unspecified: Secondary | ICD-10-CM | POA: Insufficient documentation

## 2023-08-01 DIAGNOSIS — Z3A24 24 weeks gestation of pregnancy: Secondary | ICD-10-CM | POA: Diagnosis present

## 2023-08-01 DIAGNOSIS — O3410 Maternal care for benign tumor of corpus uteri, unspecified trimester: Secondary | ICD-10-CM | POA: Diagnosis present

## 2023-08-01 NOTE — Progress Notes (Signed)
 MFM Consult Note  [name] is currently at 24 weeks and 1 day.  She was seen due to advanced maternal age and as a choroid plexus cyst was noted on a fetal anatomy scan performed in your office.  She denies any significant past medical history and denies any problems in her current pregnancy.    She had a cell free DNA test earlier in her pregnancy which indicated a low risk for trisomy 54, 76, and 13. A female fetus is predicted.   She was informed that the fetal growth and amniotic fluid level were appropriate for her gestational age.   There were no obvious fetal anomalies noted on today's ultrasound exam.  The previously noted choroid plexus cyst has resolved.  The patient was informed that anomalies may be missed due to technical limitations. If the fetus is in a suboptimal position or maternal habitus is increased, visualization of the fetus in the maternal uterus may be impaired.  A 3 cm anterior fibroid is noted on today's exam.    The following were discussed during today's consultation:  Previously noted choroid plexus cyst  She was advised that the choroid plexus cysts are most likely normal variants and will usually resolve at around 24 weeks.    The small association of bilateral choroid plexus cysts with trisomy 18 was discussed.  She was advised that as no other anomalies were noted on today's ultrasound exam, it is highly unlikely that her fetus has trisomy 65.    Due to the small association between choroid plexus cysts and trisomy 38 and due to advanced maternal age, she was offered and declined an amniocentesis today for definitive diagnosis of fetal aneuploidy.  She is comfortable with the low risk indicated by her cell free DNA test.  Fibroids in pregnancy  The increased risk of maternal pain issues and fetal growth issues later in her pregnancy due to the fibroids was discussed.    A follow-up growth scan was scheduled in 6 weeks.    The patient stated  that all of her questions were answered today.  A total of 30 minutes was spent counseling and coordinating the care for this patient.  Greater than 50% of the time was spent in direct face-to-face contact.

## 2023-09-10 ENCOUNTER — Ambulatory Visit

## 2023-09-11 ENCOUNTER — Other Ambulatory Visit: Payer: Self-pay | Admitting: *Deleted

## 2023-09-11 ENCOUNTER — Ambulatory Visit (HOSPITAL_BASED_OUTPATIENT_CLINIC_OR_DEPARTMENT_OTHER): Admitting: Maternal & Fetal Medicine

## 2023-09-11 ENCOUNTER — Ambulatory Visit: Attending: Obstetrics

## 2023-09-11 DIAGNOSIS — O09523 Supervision of elderly multigravida, third trimester: Secondary | ICD-10-CM

## 2023-09-11 DIAGNOSIS — D259 Leiomyoma of uterus, unspecified: Secondary | ICD-10-CM

## 2023-09-11 DIAGNOSIS — O3413 Maternal care for benign tumor of corpus uteri, third trimester: Secondary | ICD-10-CM

## 2023-09-11 DIAGNOSIS — Z3A30 30 weeks gestation of pregnancy: Secondary | ICD-10-CM

## 2023-09-11 DIAGNOSIS — O3410 Maternal care for benign tumor of corpus uteri, unspecified trimester: Secondary | ICD-10-CM | POA: Insufficient documentation

## 2023-09-11 DIAGNOSIS — O34219 Maternal care for unspecified type scar from previous cesarean delivery: Secondary | ICD-10-CM | POA: Diagnosis not present

## 2023-09-11 DIAGNOSIS — O09522 Supervision of elderly multigravida, second trimester: Secondary | ICD-10-CM | POA: Insufficient documentation

## 2023-09-11 NOTE — Progress Notes (Deleted)
No consult needed.

## 2023-09-11 NOTE — Progress Notes (Signed)
No consult needed.

## 2023-09-12 ENCOUNTER — Ambulatory Visit

## 2023-10-23 ENCOUNTER — Ambulatory Visit: Attending: Obstetrics and Gynecology

## 2023-10-23 ENCOUNTER — Ambulatory Visit (HOSPITAL_BASED_OUTPATIENT_CLINIC_OR_DEPARTMENT_OTHER): Admitting: Obstetrics

## 2023-10-23 DIAGNOSIS — O09522 Supervision of elderly multigravida, second trimester: Secondary | ICD-10-CM | POA: Insufficient documentation

## 2023-10-23 DIAGNOSIS — O09523 Supervision of elderly multigravida, third trimester: Secondary | ICD-10-CM | POA: Insufficient documentation

## 2023-10-23 DIAGNOSIS — O3413 Maternal care for benign tumor of corpus uteri, third trimester: Secondary | ICD-10-CM

## 2023-10-23 DIAGNOSIS — Z3A36 36 weeks gestation of pregnancy: Secondary | ICD-10-CM | POA: Insufficient documentation

## 2023-10-23 DIAGNOSIS — O3410 Maternal care for benign tumor of corpus uteri, unspecified trimester: Secondary | ICD-10-CM | POA: Insufficient documentation

## 2023-10-23 DIAGNOSIS — D259 Leiomyoma of uterus, unspecified: Secondary | ICD-10-CM

## 2023-10-23 DIAGNOSIS — O289 Unspecified abnormal findings on antenatal screening of mother: Secondary | ICD-10-CM

## 2023-10-23 NOTE — Progress Notes (Signed)
 MFM Consult Note  [name] is currently at 36 weeks and 5 days.  She has been followed due to advanced maternal age and a fibroid uterus.    She denies any problems since her last exam and has screened negative for gestational diabetes in her current pregnancy.  On today's exam, the overall EFW measures 7 pounds 4 ounces (80th percentile for her gestational age).    There was normal amniotic fluid noted with a total AFI of 9.96 cm.    As the fetal growth is within normal limits, no further exams were scheduled in our office.  A total of 10 minutes was spent counseling and coordinating the care for this patient.  Greater than 50% of the time was spent in direct face-to-face contact.

## 2023-11-05 ENCOUNTER — Other Ambulatory Visit: Payer: Self-pay | Admitting: Obstetrics and Gynecology

## 2023-11-09 ENCOUNTER — Encounter (HOSPITAL_COMMUNITY): Payer: Self-pay | Admitting: Obstetrics and Gynecology

## 2023-11-09 ENCOUNTER — Other Ambulatory Visit: Payer: Self-pay

## 2023-11-09 ENCOUNTER — Inpatient Hospital Stay (HOSPITAL_COMMUNITY)
Admission: RE | Admit: 2023-11-09 | Discharge: 2023-11-09 | Disposition: A | Discharge status: Home / Self Care | Attending: Obstetrics and Gynecology | Admitting: Obstetrics and Gynecology

## 2023-11-09 DIAGNOSIS — O98513 Other viral diseases complicating pregnancy, third trimester: Secondary | ICD-10-CM | POA: Insufficient documentation

## 2023-11-09 DIAGNOSIS — B009 Herpesviral infection, unspecified: Secondary | ICD-10-CM | POA: Diagnosis not present

## 2023-11-09 DIAGNOSIS — Z79624 Long term (current) use of inhibitors of nucleotide synthesis: Secondary | ICD-10-CM | POA: Diagnosis not present

## 2023-11-09 DIAGNOSIS — R109 Unspecified abdominal pain: Secondary | ICD-10-CM | POA: Diagnosis present

## 2023-11-09 DIAGNOSIS — Z3A39 39 weeks gestation of pregnancy: Secondary | ICD-10-CM

## 2023-11-09 DIAGNOSIS — O219 Vomiting of pregnancy, unspecified: Secondary | ICD-10-CM

## 2023-11-09 LAB — URINALYSIS, ROUTINE W REFLEX MICROSCOPIC
Bilirubin Urine: NEGATIVE
Glucose, UA: NEGATIVE mg/dL
Hgb urine dipstick: NEGATIVE
Ketones, ur: NEGATIVE mg/dL
Leukocytes,Ua: NEGATIVE
Nitrite: NEGATIVE
Protein, ur: NEGATIVE mg/dL
Specific Gravity, Urine: 1.015 (ref 1.005–1.030)
pH: 6 (ref 5.0–8.0)

## 2023-11-09 LAB — COMPREHENSIVE METABOLIC PANEL WITH GFR
ALT: 17 U/L (ref 0–44)
AST: 19 U/L (ref 15–41)
Albumin: 2.6 g/dL — ABNORMAL LOW (ref 3.5–5.0)
Alkaline Phosphatase: 136 U/L — ABNORMAL HIGH (ref 38–126)
Anion gap: 9 (ref 5–15)
BUN: 8 mg/dL (ref 6–20)
CO2: 22 mmol/L (ref 22–32)
Calcium: 9.2 mg/dL (ref 8.9–10.3)
Chloride: 106 mmol/L (ref 98–111)
Creatinine, Ser: 0.75 mg/dL (ref 0.44–1.00)
GFR, Estimated: >60 mL/min (ref >60)
Glucose, Bld: 117 mg/dL — ABNORMAL HIGH (ref 70–99)
Potassium: 4.1 mmol/L (ref 3.5–5.1)
Sodium: 137 mmol/L (ref 135–145)
Total Bilirubin: 0.4 mg/dL (ref 0.0–1.2)
Total Protein: 5.8 g/dL — ABNORMAL LOW (ref 6.5–8.1)

## 2023-11-09 LAB — CBC WITH DIFFERENTIAL/PLATELET
Abs Immature Granulocytes: 0.06 10*3/uL (ref 0.00–0.07)
Basophils Absolute: 0 10*3/uL (ref 0.0–0.1)
Basophils Relative: 0 %
Eosinophils Absolute: 0.1 10*3/uL (ref 0.0–0.5)
Eosinophils Relative: 1 %
HCT: 33.9 % — ABNORMAL LOW (ref 36.0–46.0)
Hemoglobin: 11.3 g/dL — ABNORMAL LOW (ref 12.0–15.0)
Immature Granulocytes: 1 %
Lymphocytes Relative: 29 %
Lymphs Abs: 3.1 10*3/uL (ref 0.7–4.0)
MCH: 31.8 pg (ref 26.0–34.0)
MCHC: 33.3 g/dL (ref 30.0–36.0)
MCV: 95.5 fL (ref 80.0–100.0)
Monocytes Absolute: 0.7 10*3/uL (ref 0.1–1.0)
Monocytes Relative: 7 %
Neutro Abs: 6.8 10*3/uL (ref 1.7–7.7)
Neutrophils Relative %: 62 %
Platelets: 203 10*3/uL (ref 150–400)
RBC: 3.55 MIL/uL — ABNORMAL LOW (ref 3.87–5.11)
RDW: 13.8 % (ref 11.5–15.5)
WBC: 10.7 10*3/uL — ABNORMAL HIGH (ref 4.0–10.5)
nRBC: 0 % (ref 0.0–0.2)

## 2023-11-09 LAB — PROTEIN / CREATININE RATIO, URINE
Creatinine, Urine: 133 mg/dL
Protein Creatinine Ratio: 0.14 mg/mg{creat} (ref 0.00–0.15)
Total Protein, Urine: 19 mg/dL

## 2023-11-09 MED ORDER — METOCLOPRAMIDE HCL 10 MG PO TABS
10.0000 mg | ORAL_TABLET | Freq: Once | ORAL | Status: AC
  Administered 2023-11-09: 10 mg via ORAL
  Filled 2023-11-09: qty 1

## 2023-11-09 MED ORDER — ONDANSETRON 4 MG PO TBDP
8.0000 mg | ORAL_TABLET | Freq: Once | ORAL | Status: AC
  Administered 2023-11-09: 8 mg via ORAL
  Filled 2023-11-09: qty 2

## 2023-11-09 MED ORDER — METOCLOPRAMIDE HCL 10 MG PO TABS: 10.0000 mg | ORAL_TABLET | Freq: Four times a day (QID) | ORAL | 0 refills | Status: DC

## 2023-11-09 MED ORDER — ONDANSETRON HCL 4 MG PO TABS: 4.0000 mg | ORAL_TABLET | Freq: Three times a day (TID) | ORAL | 0 refills | Status: DC | PRN

## 2023-11-09 MED ORDER — VALACYCLOVIR HCL 500 MG PO TABS: 500.0000 mg | ORAL_TABLET | Freq: Two times a day (BID) | ORAL | 6 refills | Status: AC

## 2023-11-09 NOTE — MAU Provider Note (Signed)
 Chief Complaint:  Abdominal Pain, Pelvic Pain, and Emesis   HPI   None     [name] is a 38 y.o. [phone] at [redacted]w[redacted]d who presents to maternity admissions reporting abdominal pain since this morning. She reports contraction pain after. She reports she threw up 4x in an hour. She reports good fetal movement, denies vaginal bleeding, LOF. Denies history of elevated BP readings. Reports she has no headache, visual changes, or RUQ pain.    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 Has induction scheduled next week. 10/2123.   Pregnancy Course: ***  Past Medical History:  Diagnosis Date  . Anxiety   . Chlamydia   . Depression    2007  . Eczema   . Fibroid   . Herpes   . Hx of varicella   . No pertinent past medical history   . Slipped cervical disc   . Superficial thrombophlebitis of right leg 05/27/2013  . Trichomonas   . VBAC, delivered, current hospitalization 12/10/2015  . Vitamin D deficiency    OB History  Gravida Para Term Preterm AB Living  4 2 2  1 2   SAB IAB Ectopic Multiple Live Births  1   0 2    # Outcome Date GA Lbr Len/2nd Weight Sex Type Anes PTL Lv  4 Current           3 Term 12/10/15 [redacted]w[redacted]d 10:36 / 01:41 3025 g F Vag-Spont EPI  LIV  2 Term 08/22/09    M CS-LTranv     1 SAB 2009           Past Surgical History:  Procedure Laterality Date  . CESAREAN SECTION    . WISDOM TOOTH EXTRACTION     Family History  Adopted: Yes  Problem Relation Age of Onset  . Other Neg Hx   . Allergic rhinitis Neg Hx   . Angioedema Neg Hx   . Asthma Neg Hx   . Atopy Neg Hx   . Eczema Neg Hx   . Immunodeficiency Neg Hx   . Urticaria Neg Hx    Social History   Tobacco Use  . Smoking status: Former    Current packs/day: 0.00    Types: Cigarettes    Quit date: 04/23/2015    Years since quitting: 8.5  . Smokeless tobacco: Never  Vaping Use  . Vaping status: Never Used  Substance Use Topics  . Alcohol use: Not Currently    Comment: occ  . Drug use: No   Allergies   Allergen Reactions  . Clindamycin/Lincomycin Other (See Comments)    Pt states that this medication causes her skin to peel on her perineum, lower pubic area, and groin.  . Amoxapine And Related Other (See Comments)    Pt states that this medication causes her skin to peel on her perineum, lower pubic area, and groin.   Medications Prior to Admission  Medication Sig Dispense Refill Last Dose/Taking  . Prenatal Vit-Fe Fumarate-FA (M-NATAL PLUS PO) Take by mouth.   Past Month  . cholecalciferol (VITAMIN D3) 25 MCG (1000 UNIT) tablet Take 1,000 Units by mouth daily. (Patient not taking: Reported on 08/01/2023)       I have reviewed patient's Past Medical Hx, Surgical Hx, Family Hx, Social Hx, medications and allergies.   ROS  Pertinent items noted in HPI and remainder of comprehensive ROS otherwise negative.   PHYSICAL EXAM  Patient Vitals for the past 24 hrs:  BP Temp Temp src Pulse Resp SpO2  Height Weight  11/09/23 2019 (!) 131/90 99 F (37.2 C) Oral 97 19 100 % 5' 5 (1.651 m) 101.8 kg    Physical Exam Vitals and nursing note reviewed. Exam conducted with a chaperone present.  Constitutional:      Appearance: Normal appearance.  HENT:     Head: Normocephalic.   Cardiovascular:     Rate and Rhythm: Normal rate.     Pulses: Normal pulses.  Pulmonary:     Effort: Pulmonary effort is normal.   Skin:    General: Skin is warm and dry.     Capillary Refill: Capillary refill takes less than 2 seconds.   Neurological:     General: No focal deficit present.     Mental Status: She is alert and oriented to person, place, and time.   Psychiatric:        Mood and Affect: Mood normal.        Behavior: Behavior normal.        Thought Content: Thought content normal.        Judgment: Judgment normal.        Fetal Tracing: Baseline: *** Variability: Accelerations:  Decelerations: Toco:    Labs: Results for orders placed or performed during the hospital encounter of  11/09/23 (from the past 24 hours)  Urinalysis, Routine w reflex microscopic -Urine, Clean Catch     Status: Abnormal   Collection Time: 11/09/23  8:25 PM  Result Value Ref Range   Color, Urine YELLOW YELLOW   APPearance HAZY (A) CLEAR   Specific Gravity, Urine 1.015 1.005 - 1.030   pH 6.0 5.0 - 8.0   Glucose, UA NEGATIVE NEGATIVE mg/dL   Hgb urine dipstick NEGATIVE NEGATIVE   Bilirubin Urine NEGATIVE NEGATIVE   Ketones, ur NEGATIVE NEGATIVE mg/dL   Protein, ur NEGATIVE NEGATIVE mg/dL   Nitrite NEGATIVE NEGATIVE   Leukocytes,Ua NEGATIVE NEGATIVE    Imaging:  No results found.  MDM & MAU COURSE  MDM:  MAU Course: Orders Placed This Encounter  Procedures  . Urinalysis, Routine w reflex microscopic -Urine, Clean Catch  . CBC with Differential/Platelet  . Comprehensive metabolic panel  . Protein / creatinine ratio, urine   No orders of the defined types were placed in this encounter.   ASSESSMENT  No diagnosis found.  PLAN  Discharge home in stable condition.  ***    Allergies as of 11/09/2023       Reactions   Clindamycin/lincomycin Other (See Comments)   Pt states that this medication causes her skin to peel on her perineum, lower pubic area, and groin.   Amoxapine And Related Other (See Comments)   Pt states that this medication causes her skin to peel on her perineum, lower pubic area, and groin.     Med Rec must be completed prior to using this SMARTLINK***       Raford Bunk, MSN, CNM 11/09/2023 8:50 PM  Certified Nurse Midwife, [facility]

## 2023-11-09 NOTE — MAU Note (Signed)
 [name] is a 38 y.o. at [redacted]w[redacted]d here in MAU reporting: abdominal pain on and off all day - aching on right and left side. Reports stabbing pelvic pain on her left side. Emesis x4 today. Denies VB or LOF. +FM   LMP: NA Onset of complaint: all day  Pain score: 7 - pelvic; 6 - abdomen Vitals:   11/09/23 2019  BP: (!) 131/90  Pulse: 97  Resp: 19  Temp: 99 F (37.2 C)  SpO2: 100%     FHT: 157  Lab orders placed from triage: UA

## 2023-11-11 ENCOUNTER — Telehealth (HOSPITAL_COMMUNITY): Payer: Self-pay | Admitting: *Deleted

## 2023-11-11 ENCOUNTER — Encounter (HOSPITAL_COMMUNITY): Payer: Self-pay | Admitting: *Deleted

## 2023-11-11 LAB — OB RESULTS CONSOLE GBS: GBS: POSITIVE

## 2023-11-11 NOTE — Telephone Encounter (Signed)
 Preadmission screen

## 2023-11-15 ENCOUNTER — Inpatient Hospital Stay (HOSPITAL_COMMUNITY): Admitting: Anesthesiology

## 2023-11-15 ENCOUNTER — Inpatient Hospital Stay (HOSPITAL_COMMUNITY)
Admission: RE | Admit: 2023-11-15 | Discharge: 2023-11-17 | DRG: 806 | Disposition: A | Discharge status: Home / Self Care | Payer: Self-pay | Attending: Obstetrics and Gynecology | Admitting: Obstetrics and Gynecology

## 2023-11-15 ENCOUNTER — Inpatient Hospital Stay (HOSPITAL_COMMUNITY)

## 2023-11-15 ENCOUNTER — Encounter (HOSPITAL_COMMUNITY): Payer: Self-pay | Admitting: Obstetrics and Gynecology

## 2023-11-15 ENCOUNTER — Other Ambulatory Visit: Payer: Self-pay

## 2023-11-15 DIAGNOSIS — D259 Leiomyoma of uterus, unspecified: Secondary | ICD-10-CM | POA: Diagnosis present

## 2023-11-15 DIAGNOSIS — O139 Gestational [pregnancy-induced] hypertension without significant proteinuria, unspecified trimester: Secondary | ICD-10-CM | POA: Diagnosis not present

## 2023-11-15 DIAGNOSIS — O26893 Other specified pregnancy related conditions, third trimester: Secondary | ICD-10-CM | POA: Diagnosis present

## 2023-11-15 DIAGNOSIS — O09529 Supervision of elderly multigravida, unspecified trimester: Principal | ICD-10-CM

## 2023-11-15 DIAGNOSIS — O99824 Streptococcus B carrier state complicating childbirth: Secondary | ICD-10-CM | POA: Diagnosis present

## 2023-11-15 DIAGNOSIS — Z87891 Personal history of nicotine dependence: Secondary | ICD-10-CM

## 2023-11-15 DIAGNOSIS — A6000 Herpesviral infection of urogenital system, unspecified: Secondary | ICD-10-CM | POA: Diagnosis present

## 2023-11-15 DIAGNOSIS — O9832 Other infections with a predominantly sexual mode of transmission complicating childbirth: Secondary | ICD-10-CM | POA: Diagnosis present

## 2023-11-15 DIAGNOSIS — O3413 Maternal care for benign tumor of corpus uteri, third trimester: Secondary | ICD-10-CM | POA: Diagnosis present

## 2023-11-15 DIAGNOSIS — O34211 Maternal care for low transverse scar from previous cesarean delivery: Secondary | ICD-10-CM | POA: Diagnosis present

## 2023-11-15 DIAGNOSIS — O34219 Maternal care for unspecified type scar from previous cesarean delivery: Secondary | ICD-10-CM | POA: Diagnosis not present

## 2023-11-15 DIAGNOSIS — Z3490 Encounter for supervision of normal pregnancy, unspecified, unspecified trimester: Secondary | ICD-10-CM | POA: Diagnosis present

## 2023-11-15 DIAGNOSIS — O1404 Mild to moderate pre-eclampsia, complicating childbirth: Principal | ICD-10-CM | POA: Diagnosis present

## 2023-11-15 DIAGNOSIS — O1490 Unspecified pre-eclampsia, unspecified trimester: Secondary | ICD-10-CM | POA: Diagnosis not present

## 2023-11-15 DIAGNOSIS — Z3A40 40 weeks gestation of pregnancy: Secondary | ICD-10-CM | POA: Diagnosis not present

## 2023-11-15 LAB — CBC
HCT: 34.5 % — ABNORMAL LOW (ref 36.0–46.0)
Hemoglobin: 11.3 g/dL — ABNORMAL LOW (ref 12.0–15.0)
MCH: 31 pg (ref 26.0–34.0)
MCHC: 32.8 g/dL (ref 30.0–36.0)
MCV: 94.5 fL (ref 80.0–100.0)
Platelets: 214 10*3/uL (ref 150–400)
RBC: 3.65 MIL/uL — ABNORMAL LOW (ref 3.87–5.11)
RDW: 13.9 % (ref 11.5–15.5)
WBC: 9.2 10*3/uL (ref 4.0–10.5)
nRBC: 0 % (ref 0.0–0.2)

## 2023-11-15 LAB — PROTEIN / CREATININE RATIO, URINE
Creatinine, Urine: 147 mg/dL
Protein Creatinine Ratio: 0.39 mg/mg{creat} — ABNORMAL HIGH (ref 0.00–0.15)
Total Protein, Urine: 57 mg/dL

## 2023-11-15 LAB — TYPE AND SCREEN
ABO/RH(D): O POS
Antibody Screen: NEGATIVE

## 2023-11-15 MED ORDER — EPHEDRINE 5 MG/ML INJ: 10.0000 mg | INTRAVENOUS | Status: DC | PRN

## 2023-11-15 MED ORDER — FENTANYL-BUPIVACAINE-NACL 0.5-0.125-0.9 MG/250ML-% EP SOLN: 12.0000 mL/h | EPIDURAL | Status: DC | PRN

## 2023-11-15 MED ORDER — PENICILLIN G POT IN DEXTROSE 60000 UNIT/ML IV SOLN
3.0000 10*6.[IU] | INTRAVENOUS | Status: DC
  Administered 2023-11-15 (×3): 3 10*6.[IU] via INTRAVENOUS
  Filled 2023-11-15 (×5): qty 50

## 2023-11-15 MED ORDER — LACTATED RINGERS IV SOLN
500.0000 mL | INTRAVENOUS | Status: DC | PRN
  Administered 2023-11-15: 500 mL via INTRAVENOUS

## 2023-11-15 MED ORDER — ONDANSETRON HCL 4 MG/2ML IJ SOLN: 4.0000 mg | Freq: Four times a day (QID) | INTRAMUSCULAR | Status: DC | PRN

## 2023-11-15 MED ORDER — SOD CITRATE-CITRIC ACID 500-334 MG/5ML PO SOLN: 30.0000 mL | ORAL | Status: DC | PRN

## 2023-11-15 MED ORDER — LACTATED RINGERS IV SOLN: 500.0000 mL | Freq: Once | INTRAVENOUS | Status: DC

## 2023-11-15 MED ORDER — DIPHENHYDRAMINE HCL 50 MG/ML IJ SOLN
25.0000 mg | Freq: Once | INTRAMUSCULAR | Status: AC
  Administered 2023-11-15: 25 mg via INTRAVENOUS

## 2023-11-15 MED ORDER — DIPHENHYDRAMINE HCL 50 MG/ML IJ SOLN: 12.5000 mg | INTRAMUSCULAR | Status: DC | PRN

## 2023-11-15 MED ORDER — TERBUTALINE SULFATE 1 MG/ML IJ SOLN: 0.2500 mg | Freq: Once | INTRAMUSCULAR | Status: DC | PRN

## 2023-11-15 MED ORDER — OXYTOCIN BOLUS FROM INFUSION
333.0000 mL | Freq: Once | INTRAVENOUS | Status: AC
  Administered 2023-11-16: 333 mL via INTRAVENOUS

## 2023-11-15 MED ORDER — PHENYLEPHRINE 80 MCG/ML (10ML) SYRINGE FOR IV PUSH (FOR BLOOD PRESSURE SUPPORT): 80.0000 ug | PREFILLED_SYRINGE | INTRAVENOUS | Status: DC | PRN

## 2023-11-15 MED ORDER — LACTATED RINGERS AMNIOINFUSION: INTRAVENOUS | Status: DC

## 2023-11-15 MED ORDER — LIDOCAINE HCL (PF) 1 % IJ SOLN
INTRAMUSCULAR | Status: DC | PRN
  Administered 2023-11-15: 8 mL via EPIDURAL

## 2023-11-15 MED ORDER — LACTATED RINGERS IV SOLN
500.0000 mL | Freq: Once | INTRAVENOUS | Status: AC
  Administered 2023-11-15: 500 mL via INTRAVENOUS

## 2023-11-15 MED ORDER — HYDROXYZINE HCL 50 MG PO TABS
25.0000 mg | ORAL_TABLET | Freq: Once | ORAL | Status: AC
  Administered 2023-11-15: 25 mg via ORAL
  Filled 2023-11-15: qty 1

## 2023-11-15 MED ORDER — LIDOCAINE HCL (PF) 1 % IJ SOLN: 30.0000 mL | INTRAMUSCULAR | Status: DC | PRN

## 2023-11-15 MED ORDER — FLEET ENEMA RE ENEM: 1.0000 | ENEMA | RECTAL | Status: DC | PRN

## 2023-11-15 MED ORDER — DIPHENHYDRAMINE HCL 50 MG/ML IJ SOLN
12.5000 mg | INTRAMUSCULAR | Status: DC | PRN
  Filled 2023-11-15: qty 1

## 2023-11-15 MED ORDER — ACETAMINOPHEN 325 MG PO TABS: 650.0000 mg | ORAL_TABLET | ORAL | Status: DC | PRN

## 2023-11-15 MED ORDER — OXYTOCIN-SODIUM CHLORIDE 30-0.9 UT/500ML-% IV SOLN
2.5000 [IU]/h | INTRAVENOUS | Status: DC
  Administered 2023-11-16: 2.5 [IU]/h via INTRAVENOUS

## 2023-11-15 MED ORDER — FENTANYL-BUPIVACAINE-NACL 0.5-0.125-0.9 MG/250ML-% EP SOLN
12.0000 mL/h | EPIDURAL | Status: DC | PRN
  Administered 2023-11-15: 12 mL/h via EPIDURAL
  Filled 2023-11-15: qty 250

## 2023-11-15 MED ORDER — OXYTOCIN-SODIUM CHLORIDE 30-0.9 UT/500ML-% IV SOLN
1.0000 m[IU]/min | INTRAVENOUS | Status: DC
  Administered 2023-11-15: 6 m[IU]/min via INTRAVENOUS
  Administered 2023-11-15: 2 m[IU]/min via INTRAVENOUS
  Filled 2023-11-15: qty 500

## 2023-11-15 MED ORDER — LACTATED RINGERS IV SOLN: INTRAVENOUS | Status: DC

## 2023-11-15 MED ORDER — FENTANYL CITRATE (PF) 100 MCG/2ML IJ SOLN
50.0000 ug | INTRAMUSCULAR | Status: DC | PRN
  Administered 2023-11-15: 100 ug via INTRAVENOUS
  Administered 2023-11-15: 50 ug via INTRAVENOUS
  Filled 2023-11-15 (×3): qty 2

## 2023-11-15 MED ORDER — SODIUM CHLORIDE 0.9 % IV SOLN
5.0000 10*6.[IU] | Freq: Once | INTRAVENOUS | Status: AC
  Administered 2023-11-15: 5 10*6.[IU] via INTRAVENOUS
  Filled 2023-11-15: qty 5

## 2023-11-15 MED ORDER — OXYTOCIN-SODIUM CHLORIDE 30-0.9 UT/500ML-% IV SOLN: 1.0000 m[IU]/min | INTRAVENOUS | Status: DC

## 2023-11-15 NOTE — Anesthesia Preprocedure Evaluation (Signed)
 Anesthesia Evaluation  Patient identified by MRN, date of birth, ID band Patient awake    Reviewed: Allergy  & Precautions, H&P , NPO status , Patient's Chart, lab work & pertinent test results, reviewed documented beta blocker date and time   Airway Mallampati: I  TM Distance: >3 FB Neck ROM: full    Dental no notable dental hx. (+) Poor Dentition, Dental Advisory Given   Pulmonary neg pulmonary ROS, former smoker   Pulmonary exam normal breath sounds clear to auscultation       Cardiovascular negative cardio ROS Normal cardiovascular exam Rhythm:regular Rate:Normal     Neuro/Psych  PSYCHIATRIC DISORDERS Anxiety Depression    negative neurological ROS     GI/Hepatic negative GI ROS, Neg liver ROS,,,  Endo/Other  negative endocrine ROS    Renal/GU negative Renal ROS  negative genitourinary   Musculoskeletal   Abdominal   Peds  Hematology negative hematology ROS (+)   Anesthesia Other Findings   Reproductive/Obstetrics (+) Pregnancy                             Anesthesia Physical Anesthesia Plan  ASA: 2  Anesthesia Plan: Epidural   Post-op Pain Management: Minimal or no pain anticipated   Induction:   PONV Risk Score and Plan: 2 and Ondansetron   Airway Management Planned: Simple Face Mask and Natural Airway  Additional Equipment: Fetal Monitoring  Intra-op Plan:   Post-operative Plan:   Informed Consent: I have reviewed the patients History and Physical, chart, labs and discussed the procedure including the risks, benefits and alternatives for the proposed anesthesia with the patient or authorized representative who has indicated his/her understanding and acceptance.     Dental Advisory Given  Plan Discussed with: Anesthesiologist  Anesthesia Plan Comments: (Labs checked- platelets confirmed with RN in room. Fetal heart tracing, per RN, reported to be stable enough for  sitting procedure. Discussed epidural, and patient consents to the procedure:  included risk of possible headache,backache, failed block, allergic reaction, and nerve injury. This patient was asked if she had any questions or concerns before the procedure started.)       Anesthesia Quick Evaluation

## 2023-11-15 NOTE — H&P (Cosign Needed Addendum)
 OB ADMISSION/ HISTORY & PHYSICAL:  Admission Date: 11/15/2023 12:06 AM  Admit Diagnosis: Previous cesarean section, desires trial of labor  [name] is a 38 y.o. female [phone] [redacted]w[redacted]d presenting for IOL for AMA. Priamry cesarean for [facility], followed by a successful VBAC, desires trial of labor. Endorses active FM, denies LOF and vaginal bleeding.   History of current pregnancy: [id number]   Patient entered care with [facility] at 10+3 wks.   EDC 11/15/2023 by 10+3 wk U/S.   Anatomy scan:  complete w/ posterior placenta and anterior fibroid measuring 3.03 x 1.75 x 2.4 cm Antenatal testing: for AMA started at 36 weeks Last evaluation: 36 wks vertex/ posterior placenta/ AFI 10/ EFW 7+4 (80%)  Significant prenatal events:  Patient Active Problem List   Diagnosis Date Noted   Advanced maternal age in multigravida 11/15/2023   Uterine fibroid during pregnancy, antepartum 07/29/2023   AMA (advanced maternal age) multigravida 35+, second trimester 07/29/2023   Choroid plexus cyst of fetus affecting care of mother, antepartum 07/29/2023   Previous cesarean delivery affecting pregnancy, antepartum 07/29/2023   History of VBAC 07/29/2023    Prenatal Labs: ABO, Rh: --/--/O POS (06/21 0036) Antibody: NEG (06/21 0036) Rubella: Immune (11/26 0000)  RPR: Nonreactive (11/26 0000)  HBsAg: Negative (11/26 0000)  HIV: Non-reactive (11/26 0000)  GTT: normal 1 hr GBS: Positive/-- (06/17 0000)  GC/CHL: neg/neg Genetics: low-risk  OB History  Gravida Para Term Preterm AB Living  4 2 2  1 2   SAB IAB Ectopic Multiple Live Births  1   0 2    # Outcome Date GA Lbr Len/2nd Weight Sex Type Anes PTL Lv  4 Current           3 Term 12/10/15 [redacted]w[redacted]d 10:36 / 01:41 3025 g F Vag-Spont EPI  LIV  2 Term 08/22/09    M CS-LTranv     1 SAB 2009            Medical / Surgical History: Past medical history:  Past Medical History:  Diagnosis Date   Anxiety    Chlamydia    Depression    2007   Eczema     Fibroid    Herpes    Hx of varicella    No pertinent past medical history    Slipped cervical disc    Superficial thrombophlebitis of right leg 05/27/2013   Trichomonas    VBAC, delivered, current hospitalization 12/10/2015   Vitamin D deficiency     Past surgical history:  Past Surgical History:  Procedure Laterality Date   CESAREAN SECTION     WISDOM TOOTH EXTRACTION     Family History:  Family History  Adopted: Yes  Problem Relation Age of Onset   Other Neg Hx    Allergic rhinitis Neg Hx    Angioedema Neg Hx    Asthma Neg Hx    Atopy Neg Hx    Eczema Neg Hx    Immunodeficiency Neg Hx    Urticaria Neg Hx     Social History:  reports that she quit smoking about 8 years ago. Her smoking use included cigarettes. She has never used smokeless tobacco. She reports that she does not currently use alcohol. She reports that she does not currently use drugs after having used the following drugs: Marijuana.  Allergies: Clindamycin/lincomycin and Amoxapine and related   Current Medications at time of admission:  Prior to Admission medications   Medication Sig Start Date End Date Taking? Authorizing Provider  cholecalciferol (VITAMIN D3) 25 MCG (1000 UNIT) tablet Take 1,000 Units by mouth daily. Patient not taking: Reported on 08/01/2023    [provider]  metoCLOPramide  (REGLAN ) 10 MG tablet Take 1 tablet (10 mg total) by mouth every 6 (six) hours. 11/09/23   Warren-Hill, Camie LABOR, CNM  ondansetron  (ZOFRAN ) 4 MG tablet Take 1 tablet (4 mg total) by mouth every 8 (eight) hours as needed for nausea or vomiting. 11/09/23   Regino, Camie LABOR, CNM  Prenatal Vit-Fe Fumarate-FA (M-NATAL PLUS PO) Take by mouth.    [provider]  valACYclovir  (VALTREX ) 500 MG tablet Take 1 tablet (500 mg total) by mouth 2 (two) times daily. 11/09/23   Regino Camie LABOR, CNM    Review of Systems: Constitutional: Negative   HENT: Negative   Eyes: Negative   Respiratory: Negative    Cardiovascular: Negative   Gastrointestinal: Negative  Genitourinary: neg for bloody show, neg for LOF   Musculoskeletal: Negative   Skin: Negative   Neurological: Negative   Endo/Heme/Allergies: Negative   Psychiatric/Behavioral: Negative    Physical Exam: VS: Blood pressure 115/65, pulse 84, temperature 97.8 F (36.6 C), temperature source Oral, resp. rate 17, height 5' 5 (1.651 m), weight 102.8 kg, last menstrual period 02/13/2023, unknown if currently breastfeeding. AAO x3, no signs of distress Cardiovascular: RRR Respiratory: Unlabored GU/GI: Abdomen gravid, non-tender, non-distended, active FM, vertex  Extremities: trace edema, negative for pain, tenderness, and cords  Cervical exam:Dilation: 2 Effacement (%): 60 Station: -2 Exam by:: Mercer Peal, CNM FHR: baseline rate 140 / variability moderate / accelerations present / absent decelerations TOCO: occasional   Prenatal Transfer Tool  Maternal Diabetes: No Genetic Screening: Normal Maternal Ultrasounds/Referrals: Normal Fetal Ultrasounds or other Referrals:  None Maternal Substance Abuse:  No Significant Maternal Medications:  None Significant Maternal Lab Results: Group B Strep positive Number of Prenatal Visits:greater than 3 verified prenatal visits Maternal Vaccinations:declined Other Comments:  None    Assessment: 38 y.o. [id number] [redacted]w[redacted]d IOL for AMA Hx of HSV  Latent stage of labor FHR category 1 GBS pos Pain management plan: per pt's  Hx of HSV on valtrex  spec exam negative  Plan:  Admit to L&D Routine admission orders Epidural PRN Pitocin  induction Hx HSV    -neg prodromal symptoms, neg spec exam Dr Armond notified of admission and plan of care  Mercer KATHEE Peal DNP, CNM 11/15/2023 6:14 AM

## 2023-11-15 NOTE — Progress Notes (Signed)
   Labor Progress Note  [name] is a 38 y.o. female [phone] [redacted]w[redacted]d presenting for IOL for AMA. Priamry cesarean for [facility], followed by a successful VBAC, desires trial of labor. HSV no lesions. Uterine fibroids.   Subjective: Pt in bed resting with her eyes closed, pt endorses she has lower back pain but goes away after pushing her epidural button, pt feels ok otherwise, cat 2 strip with viables with cxt continues but amnioinfusion is continuous with fluid return noted.  Patient Active Problem List   Diagnosis Date Noted   Advanced maternal age in multigravida 11/15/2023   Uterine fibroid during pregnancy, antepartum 07/29/2023   AMA (advanced maternal age) multigravida 35+, second trimester 07/29/2023   Choroid plexus cyst of fetus affecting care of mother, antepartum 07/29/2023   Previous cesarean delivery affecting pregnancy, antepartum 07/29/2023   History of VBAC 07/29/2023   Objective: BP (!) 130/98   Pulse (!) 106   Temp 98.3 F (36.8 C) (Oral)   Resp 18   Ht 5' 5 (1.651 m)   Wt 102.8 kg   LMP 02/13/2023 (Exact Date)   SpO2 100%   BMI 37.71 kg/m  No intake/output data recorded. No intake/output data recorded. NST: FHR baseline 155 bpm, Variability: moderate, Accelerations:present, Decelerations:  Present variables= Cat 2/Reactive CTX:  regular, every 3-4 minutes Uterus gravid, soft non tender, moderate to palpate with contractions.  SVE:  Dilation: 7 Effacement (%): 80 Station: 0 Exam by:: Carriann Hesse Montanna CNM Pitocin  at 69mUn/min  Assessment:  [name] is a 38 y.o. female [phone] [redacted]w[redacted]d presenting for IOL for AMA. Priamry cesarean for [facility], followed by a successful VBAC, desires trial of labor. HSV no lesions. Uterine fibroids. Progressing in active labor  Patient Active Problem List   Diagnosis Date Noted   Advanced maternal age in multigravida 11/15/2023   Uterine fibroid during pregnancy, antepartum 07/29/2023   AMA (advanced maternal age)  multigravida 35+, second trimester 07/29/2023   Choroid plexus cyst of fetus affecting care of mother, antepartum 07/29/2023   Previous cesarean delivery affecting pregnancy, antepartum 07/29/2023   History of VBAC 07/29/2023   NICHD: Category 2 Category 2 with active intrauterine resuscitative measures maternal position change with fluid bolus, and amnio.   Membranes:  11/15/2023 @ 1130, no s/s of infection  IUP: MVUs 160, with am,nio continued with fluid return   Induction:    Cytotec xN?A TOLAC  Foley Bulb: inserted and out 11/15/2023  Pitocin  - 6  Pain management:               IV pain management: x IV fentanyl  at 6/21 @ 0708, 0814  Nitrous: PRN             Epidural placement:  at 0948 on 11/15/2023  GBS Positive  Abx: PCN 11/15/2023 @ 0148, 0550, 1150, 1848  Swollen cervix: given benadryl  at 1658 with resolution.    Plan: Continue labor plan Continuous monitoring Rest Ambulate Frequent position changes to facilitate fetal rotation and descent. Will reassess with cervical exam at 4 hours or earlier if necessary Continue pitocin  per protocol titrate to MVU of 200 Amnioinfusion with continuous then 150 cc/hr. Anticipate labor progression and vaginal delivery.   Md Dillard aware of plan and verbalized agreement.   Raylee Strehl  CNM, FNP-C, PMHNP-BC  [id number]  [place], [place] [address]  Cell: [phone]  Office Phone: [phone] Fax: [phone] 11/15/2023  8:15 PM

## 2023-11-15 NOTE — Progress Notes (Signed)
 Pt states she is comfortable with epidural BP 120/74   Pulse 87   Temp 97.8 F (36.6 C) (Oral)   Resp 18   Ht 5' 5 (1.651 m)   Wt 102.8 kg   LMP 02/13/2023 (Exact Date)   SpO2 100%   BMI 37.71 kg/m  Cat 1 Toco unable to see AROM no fluid seen IUPC placed Pitocin  augmentation Anticipate SVD

## 2023-11-15 NOTE — Anesthesia Procedure Notes (Signed)
 Epidural Patient location during procedure: OB Start time: 11/15/2023 9:26 AM End time: 11/15/2023 9:30 AM  Staffing Anesthesiologist: Leonce Athens, MD  Preanesthetic Checklist Completed: patient identified, IV checked, site marked, risks and benefits discussed, surgical consent, monitors and equipment checked, pre-op evaluation and timeout performed  Epidural Patient position: sitting Prep: DuraPrep and site prepped and draped Patient monitoring: continuous pulse ox and blood pressure Approach: midline Location: L4-L5 Injection technique: LOR air  Needle:  Needle type: Tuohy  Needle gauge: 17 G Needle length: 9 cm and 9 Needle insertion depth: 7 cm Catheter type: closed end flexible Catheter size: 19 Gauge Catheter at skin depth: 13 cm Test dose: negative  Assessment Events: blood not aspirated, no cerebrospinal fluid, injection not painful, no injection resistance, no paresthesia and negative IV test

## 2023-11-16 ENCOUNTER — Encounter: Payer: Self-pay | Admitting: Family Medicine

## 2023-11-16 DIAGNOSIS — O1490 Unspecified pre-eclampsia, unspecified trimester: Secondary | ICD-10-CM | POA: Diagnosis not present

## 2023-11-16 DIAGNOSIS — O34219 Maternal care for unspecified type scar from previous cesarean delivery: Secondary | ICD-10-CM | POA: Diagnosis not present

## 2023-11-16 LAB — CBC
HCT: 31.9 % — ABNORMAL LOW (ref 36.0–46.0)
Hemoglobin: 10.6 g/dL — ABNORMAL LOW (ref 12.0–15.0)
MCH: 32.2 pg (ref 26.0–34.0)
MCHC: 33.2 g/dL (ref 30.0–36.0)
MCV: 97 fL (ref 80.0–100.0)
Platelets: 185 10*3/uL (ref 150–400)
RBC: 3.29 MIL/uL — ABNORMAL LOW (ref 3.87–5.11)
RDW: 14.1 % (ref 11.5–15.5)
WBC: 20.4 10*3/uL — ABNORMAL HIGH (ref 4.0–10.5)
nRBC: 0 % (ref 0.0–0.2)

## 2023-11-16 LAB — COMPREHENSIVE METABOLIC PANEL WITH GFR
ALT: 15 U/L (ref 0–44)
AST: 30 U/L (ref 15–41)
Albumin: 2.3 g/dL — ABNORMAL LOW (ref 3.5–5.0)
Alkaline Phosphatase: 127 U/L — ABNORMAL HIGH (ref 38–126)
Anion gap: 9 (ref 5–15)
BUN: 8 mg/dL (ref 6–20)
CO2: 18 mmol/L — ABNORMAL LOW (ref 22–32)
Calcium: 8.8 mg/dL — ABNORMAL LOW (ref 8.9–10.3)
Chloride: 106 mmol/L (ref 98–111)
Creatinine, Ser: 1.21 mg/dL — ABNORMAL HIGH (ref 0.44–1.00)
GFR, Estimated: 59 mL/min — ABNORMAL LOW (ref >60)
Glucose, Bld: 153 mg/dL — ABNORMAL HIGH (ref 70–99)
Potassium: 3.6 mmol/L (ref 3.5–5.1)
Sodium: 133 mmol/L — ABNORMAL LOW (ref 135–145)
Total Bilirubin: 0.5 mg/dL (ref 0.0–1.2)
Total Protein: 5 g/dL — ABNORMAL LOW (ref 6.5–8.1)

## 2023-11-16 LAB — RPR: RPR Ser Ql: NONREACTIVE

## 2023-11-16 MED ORDER — DIPHENHYDRAMINE HCL 25 MG PO CAPS: 25.0000 mg | ORAL_CAPSULE | Freq: Four times a day (QID) | ORAL | Status: DC | PRN

## 2023-11-16 MED ORDER — COCONUT OIL OIL
1.0000 | TOPICAL_OIL | Status: DC | PRN
  Administered 2023-11-16: 1 via TOPICAL

## 2023-11-16 MED ORDER — ONDANSETRON HCL 4 MG PO TABS: 4.0000 mg | ORAL_TABLET | ORAL | Status: DC | PRN

## 2023-11-16 MED ORDER — WITCH HAZEL-GLYCERIN EX PADS: 1.0000 | MEDICATED_PAD | CUTANEOUS | Status: DC | PRN

## 2023-11-16 MED ORDER — TETANUS-DIPHTH-ACELL PERTUSSIS 5-2.5-18.5 LF-MCG/0.5 IM SUSY: 0.5000 mL | PREFILLED_SYRINGE | Freq: Once | INTRAMUSCULAR | Status: DC

## 2023-11-16 MED ORDER — TRANEXAMIC ACID-NACL 1000-0.7 MG/100ML-% IV SOLN: 1000.0000 mg | INTRAVENOUS | Status: AC

## 2023-11-16 MED ORDER — SENNOSIDES-DOCUSATE SODIUM 8.6-50 MG PO TABS
2.0000 | ORAL_TABLET | Freq: Every day | ORAL | Status: DC
  Administered 2023-11-16 – 2023-11-17 (×2): 2 via ORAL
  Filled 2023-11-16 (×2): qty 2

## 2023-11-16 MED ORDER — ZOLPIDEM TARTRATE 5 MG PO TABS: 5.0000 mg | ORAL_TABLET | Freq: Every evening | ORAL | Status: DC | PRN

## 2023-11-16 MED ORDER — SIMETHICONE 80 MG PO CHEW: 80.0000 mg | CHEWABLE_TABLET | ORAL | Status: DC | PRN

## 2023-11-16 MED ORDER — TRANEXAMIC ACID-NACL 1000-0.7 MG/100ML-% IV SOLN
INTRAVENOUS | Status: AC
  Administered 2023-11-16: 1000 mg via INTRAVENOUS
  Filled 2023-11-16: qty 100

## 2023-11-16 MED ORDER — PRENATAL MULTIVITAMIN CH
1.0000 | ORAL_TABLET | Freq: Every day | ORAL | Status: DC
  Administered 2023-11-16 – 2023-11-17 (×2): 1 via ORAL
  Filled 2023-11-16 (×2): qty 1

## 2023-11-16 MED ORDER — ONDANSETRON HCL 4 MG/2ML IJ SOLN: 4.0000 mg | INTRAMUSCULAR | Status: DC | PRN

## 2023-11-16 MED ORDER — IBUPROFEN 600 MG PO TABS
600.0000 mg | ORAL_TABLET | Freq: Four times a day (QID) | ORAL | Status: DC
Start: 2023-11-16 — End: 2023-11-17
  Administered 2023-11-16 – 2023-11-17 (×5): 600 mg via ORAL
  Filled 2023-11-16 (×6): qty 1

## 2023-11-16 MED ORDER — BENZOCAINE-MENTHOL 20-0.5 % EX AERO: 1.0000 | INHALATION_SPRAY | CUTANEOUS | Status: DC | PRN

## 2023-11-16 MED ORDER — NIFEDIPINE ER OSMOTIC RELEASE 30 MG PO TB24
30.0000 mg | ORAL_TABLET | Freq: Every day | ORAL | Status: DC
  Administered 2023-11-16 – 2023-11-17 (×2): 30 mg via ORAL
  Filled 2023-11-16 (×2): qty 1

## 2023-11-16 MED ORDER — DIBUCAINE (PERIANAL) 1 % EX OINT: 1.0000 | TOPICAL_OINTMENT | CUTANEOUS | Status: DC | PRN

## 2023-11-16 MED ORDER — ACETAMINOPHEN 325 MG PO TABS
650.0000 mg | ORAL_TABLET | ORAL | Status: DC | PRN
  Administered 2023-11-16 (×2): 650 mg via ORAL
  Filled 2023-11-16 (×2): qty 2

## 2023-11-16 NOTE — Progress Notes (Signed)
 Called [name]  CNM for BP parameters to call MD. BPs since admission to [facility] 142/89 and 128/88; procardia given at 0222. Pt asymptomatic. Per [place], call for BPs >160/100.

## 2023-11-16 NOTE — Lactation Note (Signed)
 This note was copied from a baby's chart. Lactation Consultation Note  Patient Name: [name] Unijb'd Date: 11/16/2023 Age:37 hours Reason for consult: Initial assessment;Term;Nipple pain/trauma (MBU provided coconut oil and for some tender nipples and a hand pump. Mom has several family members in the room and not a time to assess the flanges,. Baby sound asleep and mom aware to call for latch assist, Pillows provided.) Baby has been to the breast a number of times today. 10-30 mins.  1 wet and 1 stool.    Maternal Data    Feeding Mother's Current Feeding Choice: Breast Milk  LATCH Score - 7   Lactation Tools Discussed/Used    Interventions  Education   Discharge    Consult Status Consult Status: Follow-up Date: 11/16/23 Follow-up type: In-patient    Rollene Caldron Tyeson Tanimoto 11/16/2023, 6:21 PM

## 2023-11-16 NOTE — Anesthesia Postprocedure Evaluation (Signed)
 Anesthesia Post Note  Patient: [name]  Procedure(s) Performed: AN AD HOC LABOR EPIDURAL     Patient location during evaluation: Mother Baby Anesthesia Type: Epidural Level of consciousness: awake and alert Pain management: pain level controlled Vital Signs Assessment: post-procedure vital signs reviewed and stable Respiratory status: spontaneous breathing, nonlabored ventilation and respiratory function stable Cardiovascular status: stable Postop Assessment: no headache, no backache and epidural receding Anesthetic complications: no   No notable events documented.  Last Vitals:  Vitals:   11/16/23 0200 11/16/23 0302  BP: (!) 142/89 128/88  Pulse: 94 (!) 109  Resp: 20 20  Temp: 36.9 C 37.5 C  SpO2: 100% 100%    Last Pain:  Vitals:   11/16/23 0302  TempSrc: Oral  PainSc: 0-No pain   Pain Goal:                   Grainger Mccarley

## 2023-11-16 NOTE — Progress Notes (Signed)
 CSW received consult for hx of Anxiety and Depression. CSW met with MOB to offer support and complete assessment.  When CSW entered the room, MOB was observed laying in hospital bed. Infant was asleep on his back in bassinet. FOB was present sitting nearby. CSW introduced self and requested to speak with MOB alone. CSW wrote down reason for consult to offer privacy. MOB provided consent for FOB to remain in the room. MOB presented as calm, was agreeable to CSW visit, and remained engaged during consult.  CSW inquired how MOB is feeling emotionally since infant's arrival. MOB states she is feeling alright. CSW inquired about MOB's mental health history. MOB reported she was diagnosed with depression in 2006 and told that she had anxiety while at [facility] around 2006 but has not been clinically diagnosed with anxiety. CSW inquired about mental health symptoms during pregnancy. MOB reports feeling anxious yesterday after her epidural due to her legs going numb and not liking the feeling. MOB shared she was given a PRN anxiety medication which helped her feel calm. When asked if MOB endorsed symptoms of anxiety or depression during pregnancy, MOB responded, not really. MOB states she met with a therapist virtually through a platform called [employer] during her pregnancy through her health insurance, which she found helpful. MOB reports she has continued access to a virtual therapist if needed and last met with a therapist 2 months ago. MOB states she is not prescribed medication for mental health. CSW inquired about supports. MOB identified FOB, her mom, and FOB's parents as supportive. CSW assessed for safety. MOB denied current SI/HI.  CSW provided education regarding the baby blues period vs. perinatal mood disorders, discussed treatment and gave resources for mental health follow up if concerns arise.  CSW recommends self-evaluation during the postpartum time period using the New Mom  Checklist from Postpartum Progress and encouraged MOB to contact a medical professional if symptoms are noted at any time.    MOB reports she has all needed items for infant, including a car seat and bassinet. MOB states she is undecided on a pediatrician for infant but has a list. CSW inquired about additional resource needs, MOB denied resource support.  CSW provided review of Sudden Infant Death Syndrome (SIDS) precautions.    CSW identifies no further need for intervention and no barriers to discharge at this time.  Signed,  Sharyne LOIS Roulette, MSW, [place], LCASA 11/16/2023 12:44 PM

## 2023-11-16 NOTE — Progress Notes (Addendum)
 Subjective: Postpartum Day # 1 : S/P VBAC due to pt was admitted on 11/15/2023 for TOLAC, R/T AMA at 40 weeks, pt progressed with AROM and pitocin  to VBAC on 11/15/2023 over intact perineum with ebl of was given pit and txa, Postpartum, h/o hsv no lesion, fibroids, during labor developed ghtn , no meds then develop preE w/o SF, pcr was 0.37, asymptomatic, cmp, cbc unremarkable, then PP started procardia 30mg  XL with hold parameters, this morning BP was 114/78. Patient up ad lib, denies syncope or dizziness. Reports consuming regular diet without issues and denies N/V. Patient reports 0 bowel movement + passing flatus.  Denies issues with urination and reports bleeding is lighter.  Patient is breastfeeding and reports going well.  Desires undecided for postpartum contraception.  Pain is being appropriately managed with use of po meds. Pt mood and affect still flat but pt appears awake then in LD, pt able to open her eyes, does not make eye contact but is appropriately engaged in conversations.   no laceration Feeding:  breast Contraceptive plan:  undecided BB: Circ in pt desired  Objective: Vital signs in last 24 hours: Patient Vitals for the past 24 hrs:  BP Temp Temp src Pulse Resp SpO2  11/16/23 1247 127/68 98.6 F (37 C) Oral 100 18 100 %  11/16/23 0900 112/65 98 F (36.7 C) -- 99 18 --  11/16/23 0625 114/78 98.7 F (37.1 C) Oral (!) 105 18 100 %  11/16/23 0302 128/88 99.5 F (37.5 C) Oral (!) 109 20 100 %  11/16/23 0200 (!) 142/89 98.4 F (36.9 C) Oral 94 20 100 %  11/16/23 0120 138/64 -- -- 94 -- --  11/16/23 0100 137/70 -- -- 98 -- --  11/16/23 0049 (!) 148/69 -- -- 96 -- --  11/16/23 0045 (!) 148/69 -- -- 96 -- --  11/16/23 0030 135/63 -- -- 99 -- --  11/16/23 0015 136/68 -- -- 100 -- --  11/16/23 0010 (!) 145/83 -- -- (!) 105 -- --  11/16/23 0000 (!) 145/81 -- -- (!) 123 -- --  11/15/23 2330 (!) 142/73 -- -- 89 -- --  11/15/23 2300 (!) 141/85 -- -- 90 -- --   11/15/23 2230 121/80 -- -- 87 -- --  11/15/23 2200 127/71 -- -- 85 -- --  11/15/23 2130 (!) 140/81 -- -- 97 -- --  11/15/23 2115 (!) 146/84 -- -- 86 18 --  11/15/23 1930 (!) 130/98 -- -- (!) 106 18 --  11/15/23 1902 128/87 -- -- 82 -- --  11/15/23 1832 124/81 -- -- 80 -- --  11/15/23 1801 (!) 147/88 -- -- 79 -- --  11/15/23 1758 -- 98.3 F (36.8 C) Oral -- -- --  11/15/23 1731 138/87 -- -- 83 -- --  11/15/23 1701 115/88 -- -- 98 -- --  11/15/23 1632 131/72 -- -- 80 -- --  11/15/23 1602 138/71 -- -- 80 -- --  11/15/23 1532 137/71 -- -- 81 -- --  11/15/23 1502 134/67 -- -- 79 -- --  11/15/23 1432 128/72 -- -- 94 -- --  11/15/23 1402 121/68 -- -- 85 -- --  11/15/23 1400 -- 98 F (36.7 C) Oral -- -- --  11/15/23 1332 117/65 -- -- 80 -- --     Physical Exam:  General: alert, cooperative, and appears stated age Mood/Affect: Flat Lungs: clear to auscultation, no wheezes, rales or rhonchi, symmetric air entry.  Heart: normal rate, regular rhythm, normal S1, S2, no  murmurs, rubs, clicks or gallops. Breast: breasts appear normal, no suspicious masses, no skin or nipple changes or axillary nodes. Abdomen:  + bowel sounds, soft, non-tender GU: perineum intact, healing well. No signs of external hematomas.  Uterine Fundus: firm Lochia: appropriate Skin: Warm, Dry. DVT Evaluation: No evidence of DVT seen on physical exam. Negative Homan's sign. No cords or calf tenderness. No significant calf/ankle edema.     Latest Ref Rng & Units 11/16/2023    4:24 AM 11/15/2023   12:36 AM 11/09/2023    9:04 PM  CBC  WBC 4.0 - 10.5 K/uL 20.4  9.2  10.7   Hemoglobin 12.0 - 15.0 g/dL 89.3  88.6  88.6   Hematocrit 36.0 - 46.0 % 31.9  34.5  33.9   Platelets 150 - 400 K/uL 185  214  203     Results for orders placed or performed during the hospital encounter of 11/15/23 (from the past 24 hours)  Protein / creatinine ratio, urine     Status: Abnormal   Collection Time: 11/15/23  9:54 PM  Result  Value Ref Range   Creatinine, Urine 147 mg/dL   Total Protein, Urine 57 mg/dL   Protein Creatinine Ratio 0.39 (H) 0.00 - 0.15 mg/mg[Cre]  RPR     Status: None   Collection Time: 11/15/23 10:44 PM  Result Value Ref Range   RPR Ser Ql NON REACTIVE NON REACTIVE  Comprehensive metabolic panel     Status: Abnormal   Collection Time: 11/16/23  4:24 AM  Result Value Ref Range   Sodium 133 (L) 135 - 145 mmol/L   Potassium 3.6 3.5 - 5.1 mmol/L   Chloride 106 98 - 111 mmol/L   CO2 18 (L) 22 - 32 mmol/L   Glucose, Bld 153 (H) 70 - 99 mg/dL   BUN 8 6 - 20 mg/dL   Creatinine, Ser 8.78 (H) 0.44 - 1.00 mg/dL   Calcium 8.8 (L) 8.9 - 10.3 mg/dL   Total Protein 5.0 (L) 6.5 - 8.1 g/dL   Albumin 2.3 (L) 3.5 - 5.0 g/dL   AST 30 15 - 41 U/L   ALT 15 0 - 44 U/L   Alkaline Phosphatase 127 (H) 38 - 126 U/L   Total Bilirubin 0.5 0.0 - 1.2 mg/dL   GFR, Estimated 59 (L) >60 mL/min   Anion gap 9 5 - 15  CBC     Status: Abnormal   Collection Time: 11/16/23  4:24 AM  Result Value Ref Range   WBC 20.4 (H) 4.0 - 10.5 K/uL   RBC 3.29 (L) 3.87 - 5.11 MIL/uL   Hemoglobin 10.6 (L) 12.0 - 15.0 g/dL   HCT 68.0 (L) 63.9 - 53.9 %   MCV 97.0 80.0 - 100.0 fL   MCH 32.2 26.0 - 34.0 pg   MCHC 33.2 30.0 - 36.0 g/dL   RDW 85.8 88.4 - 84.4 %   Platelets 185 150 - 400 K/uL   nRBC 0.0 0.0 - 0.2 %     CBG (last 3)  No results for input(s): GLUCAP in the last 72 hours.   I/O last 3 completed shifts: In: 0  Out: 725 [Urine:350; Blood:375]   Assessment Postpartum Day # 1 : S/P VBAC due to pt was admitted on 11/15/2023 for TOLAC, R/T AMA at 40 weeks, pt progressed with AROM and pitocin  to VBAC on 11/15/2023 over intact perineum with ebl of was given pit and txa, Postpartum, h/o hsv no lesion, fibroids, during labor developed ghtn ,  no meds then develop preE w/o SF, pcr was 0.37, asymptomatic, cmp, cbc unremarkable, then PP started procardia 30mg  XL with hold parameters, this morning BP was 114/78.Pt stable. -1  involution. Breastfeeding. Hemodynamically stable.   Plan: Continue other mgmt as ordered VTE prophylactics: Early ambulated as tolerates.  Pain control: Motrin /Tylenol  PRN H/O GAD/MDD: no meds, monitor mood, SW consulted.  PRE W/O SF: monitor BP, continue procardia 30mg  XL with hold parameter for BP <120/80 Education given regarding options for contraception, including barrier methods, injectable contraception, IUD placement, oral contraceptives.  Plan for discharge tomorrow, Breastfeeding, Lactation consult, and Circumcision prior to discharge  [facility] to be updated on patient status  Diahn Waidelich  CNM, FNP-C, PMHNP-BC  [id number]  [place], [place] [address]  Cell: [phone]  Office Phone: [phone] Fax: [phone] 11/16/2023  1:14 PM

## 2023-11-17 ENCOUNTER — Encounter (HOSPITAL_COMMUNITY): Payer: Self-pay | Admitting: Obstetrics and Gynecology

## 2023-11-17 ENCOUNTER — Other Ambulatory Visit (HOSPITAL_COMMUNITY): Payer: Self-pay

## 2023-11-17 MED ORDER — NIFEDIPINE ER 30 MG PO TB24
30.0000 mg | ORAL_TABLET | Freq: Every day | ORAL | 1 refills | Status: AC
  Filled 2023-11-17: qty 30, 30d supply, fill #0

## 2023-11-17 MED ORDER — IBUPROFEN 600 MG PO TABS
600.0000 mg | ORAL_TABLET | Freq: Four times a day (QID) | ORAL | 1 refills | Status: AC | PRN
  Filled 2023-11-17: qty 40, 10d supply, fill #0

## 2023-11-17 NOTE — Discharge Summary (Signed)
 Postpartum Discharge Summary  Date of Service updated 11-17-23     Patient Name: [name] DOB: 16-May-1986 MRN: [id number]  Date of admission: 11/15/2023 Delivery date:11/15/2023 Delivering provider: [place] , JADE Date of discharge: 11/17/2023  Admitting diagnosis: Advanced maternal age in multigravida [O09.529] Intrauterine pregnancy: [redacted]w[redacted]d     Secondary diagnosis:  Principal Problem:   Advanced maternal age in multigravida Active Problems:   Encounter for induction of labor   Preeclampsia   VBAC, delivered  Additional problems: none    Discharge diagnosis: Term Pregnancy Delivered                                              Post partum procedures:none Augmentation: Pitocin  Complications: None  Hospital course: Induction of Labor With Vaginal Delivery   38 y.o. yo [phone] at [redacted]w[redacted]d was admitted to the hospital 11/15/2023 for induction of labor.  Indication for induction: AMA.  Patient had an labor course complicated by nothing Membrane Rupture Time/Date: 11:30 AM,11/15/2023  Delivery Method:VBAC, Spontaneous Operative Delivery:N/A Episiotomy: None Lacerations:  None Details of delivery can be found in separate delivery note.  Patient had a postpartum course complicated by nothing. Patient is discharged home 11/27/23.  Newborn Data: Birth date:11/15/2023 Birth time:11:58 PM Gender:Female Living status:Living Apgars:8 ,9  Weight:3.459 kg  Magnesium Sulfate received: No BMZ received: No  Transfusion:No Immunizations administered: Immunization History  Administered Date(s) Administered   PFIZER(Purple Top)SARS-COV-2 Vaccination 09/30/2019, 10/26/2019    Physical exam  Vitals:   11/16/23 2128 11/17/23 0535 11/17/23 1016 11/17/23 1311  BP: 122/76 111/79 138/84 125/80  Pulse: 88 98 (!) 102 94  Resp: 18 18 16 16   Temp: 98.1 F (36.7 C) 98.3 F (36.8 C) 98.5 F (36.9 C) 98.4 F (36.9 C)  TempSrc: Oral Oral Oral Oral  SpO2: 100% 100% 99% 100%  Weight:       Height:       General: alert, cooperative, and no distress Lochia: appropriate Uterine Fundus: FF, NT Incision: N/A DVT Evaluation: No evidence of DVT seen on physical exam. Labs: Lab Results  Component Value Date   WBC 20.4 (H) 11/16/2023   HGB 10.6 (L) 11/16/2023   HCT 31.9 (L) 11/16/2023   MCV 97.0 11/16/2023   PLT 185 11/16/2023      Latest Ref Rng & Units 11/16/2023    4:24 AM  CMP  Glucose 70 - 99 mg/dL 846   BUN 6 - 20 mg/dL 8   Creatinine 9.55 - 8.99 mg/dL 8.78   Sodium 864 - 854 mmol/L 133   Potassium 3.5 - 5.1 mmol/L 3.6   Chloride 98 - 111 mmol/L 106   CO2 22 - 32 mmol/L 18   Calcium 8.9 - 10.3 mg/dL 8.8   Total Protein 6.5 - 8.1 g/dL 5.0   Total Bilirubin 0.0 - 1.2 mg/dL 0.5   Alkaline Phos 38 - 126 U/L 127   AST 15 - 41 U/L 30   ALT 0 - 44 U/L 15    Edinburgh Score:    11/16/2023    5:58 PM  Edinburgh Postnatal Depression Scale Screening Tool  I have been able to laugh and see the funny side of things. 0  I have looked forward with enjoyment to things. 0  I have blamed myself unnecessarily when things went wrong. 1  I have been anxious or worried for no  good reason. 2  I have felt scared or panicky for no good reason. 1  Things have been getting on top of me. 1  I have been so unhappy that I have had difficulty sleeping. 1  I have felt sad or miserable. 1  I have been so unhappy that I have been crying. 1  The thought of harming myself has occurred to me. 0  Edinburgh Postnatal Depression Scale Total 8      After visit meds:  Allergies as of 11/17/2023       Reactions   Clindamycin/lincomycin Other (See Comments)   Pt states that this medication causes her skin to peel on her perineum, lower pubic area, and groin.   Amoxapine And Related Other (See Comments)   Pt states that this medication causes her skin to peel on her perineum, lower pubic area, and groin.        Medication List     STOP taking these medications    metoCLOPramide   10 MG tablet Commonly known as: REGLAN    ondansetron  4 MG tablet Commonly known as: Zofran        TAKE these medications    cholecalciferol 25 MCG (1000 UNIT) tablet Commonly known as: VITAMIN D3 Take 1,000 Units by mouth daily.   ibuprofen  600 MG tablet Commonly known as: ADVIL  Take 1 tablet (600 mg total) by mouth every 6 (six) hours as needed for mild pain (pain score 1-3), moderate pain (pain score 4-6) or cramping.   M-NATAL PLUS PO Take by mouth.   NIFEdipine  30 MG 24 hr tablet Commonly known as: ADALAT  CC Take 1 tablet (30 mg total) by mouth daily. Start taking on: November 18, 2023   valACYclovir  500 MG tablet Commonly known as: VALTREX  Take 1 tablet (500 mg total) by mouth 2 (two) times daily.         Discharge home in stable condition Infant Feeding: Breast Infant Disposition:home with mother Discharge instruction: per After Visit Summary and Postpartum booklet. Activity: Advance as tolerated. Pelvic rest for 6 weeks.  Diet: routine diet Anticipated Birth Control: Unsure Postpartum Appointment:6 weeks Additional Postpartum F/U: n/a Future Appointments:No future appointments. Follow up Visit:      11/17/2023 Jon CINDERELLA Rummer, MD

## 2023-11-17 NOTE — Lactation Note (Signed)
 This note was copied from a baby's chart. Lactation Consultation Note  Patient Name: [name] Date: 11/17/2023 Age:37 hours  Reason for consult: Follow-up assessment;Breastfeeding assistance;Other (Comment) (sore nipples)  P3, [redacted]w[redacted]d, 1% weight loss  Follow up LC visit. Mother states she is having sore nipples with latching. Mother was receptive to [facility] observing baby latched.  Mother has baby at breast, baby is lying on the pillow and latched to mother's propped breast on the pillow, and mother is holding the pillow. Baby was latched shallow and sleeping. Baby was aligned well to the breast.  Discussed holding baby and breast to assure a deep and breast support. Discussed the benefits of baby having a wide gape and jaw deeper into the breast to sustain latch and transfer milk. Mother was receptive to demonstration of breast compression to promote milk intake and keep baby engaged. Mother was engaged and attentive to information.   Instructed mother to express colostrum and apply to nipples. She has coconut at bedside and discussed applying. Nipple skin was intact on the right breast. Did not observe the left.   Maternal Data Has patient been taught Hand Expression?: Yes Does the patient have breastfeeding experience prior to this delivery?: Yes How long did the patient breastfeed?: 1 yr and 6 months   Feeding Mother's Current Feeding Choice: Breast Milk   Lactation Tools Discussed/Used Tools: Pump Breast pump type: Manual Pump Education: Setup, frequency, and cleaning;Milk Storage Pumping frequency: prn     Discharge Discharge Education: Engorgement and breast care;Warning signs for feeding baby Pump: Manual  Consult Status Consult Status: Complete    Joshua Rojelio HERO 11/17/2023, 11:40 AM

## 2023-11-22 ENCOUNTER — Encounter (HOSPITAL_COMMUNITY)

## 2023-11-25 ENCOUNTER — Telehealth (HOSPITAL_COMMUNITY): Payer: Self-pay | Admitting: *Deleted

## 2023-11-25 NOTE — Telephone Encounter (Signed)
 11/25/2023  Name: [name] MRN: [id number] DOB: 1986/03/13  Reason for Call:  Transition of Care Hospital Discharge Call  Contact Status: Patient Contact Status: Complete  Language assistant needed: Interpreter Mode: Interpreter Not Needed        Follow-Up Questions: Do You Have Any Concerns About Your Health As You Heal From Delivery?: No Do You Have Any Concerns About Your Infants Health?: No  Edinburgh Postnatal Depression Scale:  In the Past 7 Days: I have been able to laugh and see the funny side of things.: As much as I always could I have looked forward with enjoyment to things.: As much as I ever did I have blamed myself unnecessarily when things went wrong.: No, never I have been anxious or worried for no good reason.: No, not at all I have felt scared or panicky for no good reason.: No, not at all Things have been getting on top of me.: No, I have been coping as well as ever I have been so unhappy that I have had difficulty sleeping.: Not at all I have felt sad or miserable.: Not very often I have been so unhappy that I have been crying.: Only occasionally The thought of harming myself has occurred to me.: Never Edinburgh Postnatal Depression Scale Total: 2  PHQ2-9 Depression Scale:     Discharge Follow-up: Edinburgh score requires follow up?: No Patient was advised of the following resources:: Support Group, Breastfeeding Support Group  Post-discharge interventions: Reviewed Newborn Safe Sleep Practices  Mliss Sieve, RN 11/25/2023 12:51
